# Patient Record
Sex: Male | Born: 1945 | Hispanic: No | Marital: Married | Smoking: Never smoker
Health system: Southern US, Community
[De-identification: ages and names within clinical notes are randomized; demographics above are authoritative.]

## PROBLEM LIST (undated history)

## (undated) DIAGNOSIS — G709 Myoneural disorder, unspecified: Secondary | ICD-10-CM

## (undated) DIAGNOSIS — F419 Anxiety disorder, unspecified: Secondary | ICD-10-CM

## (undated) HISTORY — PX: COLONOSCOPY: SHX174

---

## 2003-02-23 ENCOUNTER — Emergency Department (HOSPITAL_COMMUNITY): Admission: EM | Admit: 2003-02-23 | Discharge: 2003-02-23 | Payer: Self-pay | Admitting: Emergency Medicine

## 2005-09-23 ENCOUNTER — Emergency Department (HOSPITAL_COMMUNITY): Admission: EM | Admit: 2005-09-23 | Discharge: 2005-09-23 | Payer: Self-pay | Admitting: Emergency Medicine

## 2006-05-22 ENCOUNTER — Emergency Department (HOSPITAL_COMMUNITY): Admission: EM | Admit: 2006-05-22 | Discharge: 2006-05-22 | Payer: Self-pay | Admitting: Emergency Medicine

## 2007-09-06 ENCOUNTER — Emergency Department (HOSPITAL_COMMUNITY): Admission: EM | Admit: 2007-09-06 | Discharge: 2007-09-06 | Payer: Self-pay | Admitting: Emergency Medicine

## 2011-08-24 LAB — BASIC METABOLIC PANEL
BUN: 18
CO2: 26
Calcium: 8.6
Chloride: 99
Creatinine, Ser: 0.97
GFR calc Af Amer: >60
GFR calc non Af Amer: >60
Glucose, Bld: 131 — ABNORMAL HIGH
Potassium: 3.6
Sodium: 134 — ABNORMAL LOW

## 2011-08-24 LAB — DIFFERENTIAL
Basophils Absolute: 0
Basophils Relative: 1
Eosinophils Absolute: 0.2
Eosinophils Relative: 5
Lymphocytes Relative: 45
Lymphs Abs: 2.3
Monocytes Absolute: 0.3
Monocytes Relative: 6
Neutro Abs: 2.2
Neutrophils Relative %: 44

## 2011-08-24 LAB — CBC
HCT: 43.2
Hemoglobin: 14.9
MCHC: 34.5
MCV: 91.6
Platelets: 190
RBC: 4.72
RDW: 12.8
WBC: 5.1

## 2011-08-24 LAB — URINALYSIS, ROUTINE W REFLEX MICROSCOPIC
Bilirubin Urine: NEGATIVE
Glucose, UA: NEGATIVE
Ketones, ur: NEGATIVE
Leukocytes, UA: NEGATIVE
Nitrite: NEGATIVE
Protein, ur: NEGATIVE
Specific Gravity, Urine: 1.025
Urobilinogen, UA: 0.2
pH: 6

## 2011-08-24 LAB — URINE MICROSCOPIC-ADD ON

## 2014-03-05 ENCOUNTER — Other Ambulatory Visit: Payer: Self-pay | Admitting: Internal Medicine

## 2014-03-05 ENCOUNTER — Ambulatory Visit
Admission: RE | Admit: 2014-03-05 | Discharge: 2014-03-05 | Disposition: A | Discharge status: Home / Self Care | Payer: 59 | Source: Ambulatory Visit | Attending: Internal Medicine | Admitting: Internal Medicine

## 2014-03-05 DIAGNOSIS — M545 Low back pain, unspecified: Secondary | ICD-10-CM

## 2014-03-05 DIAGNOSIS — M25559 Pain in unspecified hip: Secondary | ICD-10-CM

## 2014-03-05 DIAGNOSIS — M79602 Pain in left arm: Secondary | ICD-10-CM

## 2014-08-05 ENCOUNTER — Other Ambulatory Visit: Payer: Self-pay | Admitting: Family Medicine

## 2014-08-05 DIAGNOSIS — I803 Phlebitis and thrombophlebitis of lower extremities, unspecified: Secondary | ICD-10-CM

## 2014-08-06 ENCOUNTER — Other Ambulatory Visit: Payer: 59

## 2016-01-17 ENCOUNTER — Other Ambulatory Visit (HOSPITAL_COMMUNITY): Payer: Self-pay | Admitting: Psychiatry

## 2016-12-15 ENCOUNTER — Other Ambulatory Visit: Payer: Self-pay | Admitting: Orthopedic Surgery

## 2016-12-15 ENCOUNTER — Other Ambulatory Visit (HOSPITAL_COMMUNITY): Payer: Self-pay | Admitting: Orthopedic Surgery

## 2016-12-15 DIAGNOSIS — M25512 Pain in left shoulder: Secondary | ICD-10-CM

## 2016-12-26 ENCOUNTER — Ambulatory Visit (HOSPITAL_COMMUNITY): Payer: 59

## 2016-12-26 ENCOUNTER — Other Ambulatory Visit: Payer: Self-pay

## 2018-09-06 ENCOUNTER — Other Ambulatory Visit: Payer: Self-pay | Admitting: Family Medicine

## 2018-09-06 DIAGNOSIS — R1084 Generalized abdominal pain: Secondary | ICD-10-CM

## 2018-09-06 DIAGNOSIS — M549 Dorsalgia, unspecified: Secondary | ICD-10-CM

## 2018-09-06 DIAGNOSIS — R310 Gross hematuria: Secondary | ICD-10-CM

## 2018-09-10 ENCOUNTER — Ambulatory Visit
Admission: RE | Admit: 2018-09-10 | Discharge: 2018-09-10 | Disposition: A | Discharge status: Home / Self Care | Payer: 59 | Source: Ambulatory Visit | Attending: Family Medicine | Admitting: Family Medicine

## 2018-09-10 DIAGNOSIS — R310 Gross hematuria: Secondary | ICD-10-CM

## 2018-09-10 DIAGNOSIS — M549 Dorsalgia, unspecified: Secondary | ICD-10-CM

## 2018-09-10 DIAGNOSIS — R1084 Generalized abdominal pain: Secondary | ICD-10-CM

## 2018-09-18 DIAGNOSIS — N2889 Other specified disorders of kidney and ureter: Secondary | ICD-10-CM | POA: Diagnosis not present

## 2018-09-18 DIAGNOSIS — R310 Gross hematuria: Secondary | ICD-10-CM | POA: Diagnosis not present

## 2018-09-21 ENCOUNTER — Other Ambulatory Visit: Payer: Self-pay | Admitting: Urology

## 2018-09-25 NOTE — Patient Instructions (Addendum)
Derek May  09/25/2018   Your procedure is scheduled on: 09-28-18     Report to [facility] Main  Entrance    Report to admitting at 1:15 PM    Call this number if you have problems the morning of surgery [phone]     Remember: You may have a Clear Liquid Diet after Midnight until 9:15 AM. After 9:15 AM, nothing until after surgery.      CLEAR LIQUID DIET   Foods Allowed                                                                     Foods Excluded  Coffee and tea, regular and decaf                             liquids that you cannot  Plain Jell-O in any flavor                                             see through such as: Fruit ices (not with fruit pulp)                                     milk, soups, orange juice  Iced Popsicles                                    All solid food Carbonated beverages, regular and diet                                    Cranberry, grape and apple juices Sports drinks like Gatorade Lightly seasoned clear broth or consume(fat free) Sugar, honey syrup  Sample Menu Breakfast                                Lunch                                     Supper Cranberry juice                    Beef broth                            Chicken broth Jell-O                                     Grape juice                           Apple juice Coffee  or tea                        Jell-O                                      Popsicle                                                Coffee or tea                        Coffee or tea  _____________________________________________________________________    Take these medicines the morning of surgery with A SIP OF WATER: Lexapro (Escitalopram)    BRUSH YOUR TEETH MORNING OF SURGERY AND RINSE YOUR MOUTH OUT, NO CHEWING GUM CANDY OR MINTS.                                You may not have any metal on your body including hair pins and              piercings  Do not wear  jewelry, lotions, powders, colgone or deodorant             Men may shave face and neck.   Do not bring valuables to the hospital. [place].  Contacts, dentures or bridgework may not be worn into surgery.      Patients discharged the day of surgery will not be allowed to drive home.  Name and phone number of your driver: Bryker Fletchall [phone]               Please read over the following fact sheets you were given: _____________________________________________________________________             [facility] - Preparing for Surgery Before surgery, you can play an important role.  Because skin is not sterile, your skin needs to be as free of germs as possible.  You can reduce the number of germs on your skin by washing with CHG (chlorahexidine gluconate) soap before surgery.  CHG is an antiseptic cleaner which kills germs and bonds with the skin to continue killing germs even after washing. Please DO NOT use if you have an allergy to CHG or antibacterial soaps.  If your skin becomes reddened/irritated stop using the CHG and inform your nurse when you arrive at Short Stay. Do not shave (including legs and underarms) for at least 48 hours prior to the first CHG shower.  You may shave your face/neck. Please follow these instructions carefully:  1.  Shower with CHG Soap the night before surgery and the  morning of Surgery.  2.  If you choose to wash your hair, wash your hair first as usual with your  normal  shampoo.  3.  After you shampoo, rinse your hair and body thoroughly to remove the  shampoo.                           4.  Use CHG as you would any other liquid  soap.  You can apply chg directly  to the skin and wash                       Gently with a scrungie or clean washcloth.  5.  Apply the CHG Soap to your body ONLY FROM THE NECK DOWN.   Do not use on face/ open                           Wound or open sores. Avoid contact with  eyes, ears mouth and genitals (private parts).                       Wash face,  Genitals (private parts) with your normal soap.             6.  Wash thoroughly, paying special attention to the area where your surgery  will be performed.  7.  Thoroughly rinse your body with warm water from the neck down.  8.  DO NOT shower/wash with your normal soap after using and rinsing off  the CHG Soap.                9.  Pat yourself dry with a clean towel.            10.  Wear clean pajamas.            11.  Place clean sheets on your bed the night of your first shower and do not  sleep with pets. Day of Surgery : Do not apply any lotions/deodorants the morning of surgery.  Please wear clean clothes to the hospital/surgery center.  FAILURE TO FOLLOW THESE INSTRUCTIONS MAY RESULT IN THE CANCELLATION OF YOUR SURGERY PATIENT SIGNATURE_________________________________  NURSE SIGNATURE__________________________________  ________________________________________________________________________

## 2018-09-27 ENCOUNTER — Other Ambulatory Visit: Payer: Self-pay

## 2018-09-27 ENCOUNTER — Encounter (HOSPITAL_COMMUNITY)
Admission: RE | Admit: 2018-09-27 | Discharge: 2018-09-27 | Disposition: A | Discharge status: Home / Self Care | Payer: 59 | Source: Ambulatory Visit | Attending: Urology | Admitting: Urology

## 2018-09-27 ENCOUNTER — Encounter (HOSPITAL_COMMUNITY): Payer: Self-pay

## 2018-09-27 DIAGNOSIS — Z01812 Encounter for preprocedural laboratory examination: Secondary | ICD-10-CM | POA: Insufficient documentation

## 2018-09-27 DIAGNOSIS — D494 Neoplasm of unspecified behavior of bladder: Secondary | ICD-10-CM

## 2018-09-27 HISTORY — DX: Anxiety disorder, unspecified: F41.9

## 2018-09-27 HISTORY — DX: Myoneural disorder, unspecified: G70.9

## 2018-09-27 LAB — CBC
HCT: 47.3 % (ref 39.0–52.0)
HEMOGLOBIN: 15.8 g/dL (ref 13.0–17.0)
MCH: 30.4 pg (ref 26.0–34.0)
MCHC: 33.4 g/dL (ref 30.0–36.0)
MCV: 91 fL (ref 80.0–100.0)
Platelets: 175 10*3/uL (ref 150–400)
RBC: 5.2 MIL/uL (ref 4.22–5.81)
RDW: 12.6 % (ref 11.5–15.5)
WBC: 6.2 10*3/uL (ref 4.0–10.5)
nRBC: 0 % (ref 0.0–0.2)

## 2018-09-28 ENCOUNTER — Ambulatory Visit (HOSPITAL_COMMUNITY): Payer: 59 | Admitting: Anesthesiology

## 2018-09-28 ENCOUNTER — Observation Stay (HOSPITAL_COMMUNITY)
Admission: RE | Admit: 2018-09-28 | Discharge: 2018-09-29 | Disposition: A | Discharge status: Home / Self Care | Payer: 59 | Source: Ambulatory Visit | Attending: Urology | Admitting: Urology

## 2018-09-28 ENCOUNTER — Encounter (HOSPITAL_COMMUNITY)
Admission: RE | Disposition: A | Discharge status: Home / Self Care | Payer: Self-pay | Source: Ambulatory Visit | Attending: Urology

## 2018-09-28 ENCOUNTER — Encounter (HOSPITAL_COMMUNITY): Payer: Self-pay | Admitting: *Deleted

## 2018-09-28 DIAGNOSIS — D494 Neoplasm of unspecified behavior of bladder: Principal | ICD-10-CM | POA: Insufficient documentation

## 2018-09-28 HISTORY — PX: TRANSURETHRAL RESECTION OF BLADDER TUMOR: SHX2575

## 2018-09-28 LAB — HEMOGLOBIN AND HEMATOCRIT, BLOOD
HCT: 44.8 % (ref 39.0–52.0)
HEMOGLOBIN: 15 g/dL (ref 13.0–17.0)

## 2018-09-28 SURGERY — TURBT (TRANSURETHRAL RESECTION OF BLADDER TUMOR)
Anesthesia: General | Site: Bladder

## 2018-09-28 MED ORDER — SODIUM CHLORIDE 0.9 % IR SOLN
Status: DC | PRN
  Administered 2018-09-28: 1000 mL

## 2018-09-28 MED ORDER — LIDOCAINE 2% (20 MG/ML) 5 ML SYRINGE
INTRAMUSCULAR | Status: DC | PRN
  Administered 2018-09-28: 60 mg via INTRAVENOUS

## 2018-09-28 MED ORDER — GLYCOPYRROLATE PF 0.2 MG/ML IJ SOSY
PREFILLED_SYRINGE | INTRAMUSCULAR | Status: DC | PRN
  Administered 2018-09-28: .2 mg via INTRAVENOUS

## 2018-09-28 MED ORDER — SODIUM CHLORIDE 0.9 % IV SOLN
INTRAVENOUS | Status: DC
  Administered 2018-09-28: 21:00:00 via INTRAVENOUS
  Administered 2018-09-28: 1000 mL via INTRAVENOUS
  Administered 2018-09-29: 05:00:00 via INTRAVENOUS

## 2018-09-28 MED ORDER — ONDANSETRON HCL 4 MG/2ML IJ SOLN
INTRAMUSCULAR | Status: DC | PRN
  Administered 2018-09-28: 4 mg via INTRAVENOUS

## 2018-09-28 MED ORDER — MORPHINE SULFATE (PF) 4 MG/ML IV SOLN
INTRAVENOUS | Status: AC
  Administered 2018-09-28: 2 mg via INTRAVENOUS
  Filled 2018-09-28: qty 1

## 2018-09-28 MED ORDER — DIPHENHYDRAMINE HCL 12.5 MG/5ML PO ELIX: 12.5000 mg | ORAL_SOLUTION | Freq: Four times a day (QID) | ORAL | Status: DC | PRN

## 2018-09-28 MED ORDER — ACETAMINOPHEN 10 MG/ML IV SOLN: 1000.0000 mg | Freq: Once | INTRAVENOUS | Status: DC | PRN

## 2018-09-28 MED ORDER — DEXAMETHASONE SODIUM PHOSPHATE 10 MG/ML IJ SOLN
INTRAMUSCULAR | Status: DC | PRN
  Administered 2018-09-28: 5 mg via INTRAVENOUS

## 2018-09-28 MED ORDER — PROPOFOL 10 MG/ML IV BOLUS
INTRAVENOUS | Status: DC | PRN
  Administered 2018-09-28: 50 mg via INTRAVENOUS
  Administered 2018-09-28: 120 mg via INTRAVENOUS

## 2018-09-28 MED ORDER — LACTATED RINGERS IV SOLN
INTRAVENOUS | Status: DC
  Administered 2018-09-28 (×2): via INTRAVENOUS

## 2018-09-28 MED ORDER — SENNOSIDES-DOCUSATE SODIUM 8.6-50 MG PO TABS
2.0000 | ORAL_TABLET | Freq: Every day | ORAL | Status: DC
  Administered 2018-09-28: 2 via ORAL
  Filled 2018-09-28: qty 2

## 2018-09-28 MED ORDER — OXYCODONE HCL 5 MG/5ML PO SOLN
5.0000 mg | Freq: Once | ORAL | Status: DC | PRN
  Filled 2018-09-28: qty 5

## 2018-09-28 MED ORDER — OXYCODONE HCL 5 MG PO TABS: 5.0000 mg | ORAL_TABLET | Freq: Once | ORAL | Status: DC | PRN

## 2018-09-28 MED ORDER — EPHEDRINE SULFATE-NACL 50-0.9 MG/10ML-% IV SOSY
PREFILLED_SYRINGE | INTRAVENOUS | Status: DC | PRN
  Administered 2018-09-28: 10 mg via INTRAVENOUS

## 2018-09-28 MED ORDER — FENTANYL CITRATE (PF) 100 MCG/2ML IJ SOLN
INTRAMUSCULAR | Status: AC
  Filled 2018-09-28: qty 2

## 2018-09-28 MED ORDER — HYDROCODONE-ACETAMINOPHEN 5-325 MG PO TABS: 1.0000 | ORAL_TABLET | Freq: Once | ORAL | Status: DC

## 2018-09-28 MED ORDER — HYDROCODONE-ACETAMINOPHEN 5-325 MG PO TABS: 1.0000 | ORAL_TABLET | ORAL | 0 refills | Status: AC | PRN

## 2018-09-28 MED ORDER — ONDANSETRON HCL 4 MG/2ML IJ SOLN: 4.0000 mg | INTRAMUSCULAR | Status: DC | PRN

## 2018-09-28 MED ORDER — LORAZEPAM 1 MG PO TABS
1.0000 mg | ORAL_TABLET | Freq: Every day | ORAL | Status: DC
  Administered 2018-09-28: 1 mg via ORAL
  Filled 2018-09-28: qty 1

## 2018-09-28 MED ORDER — PROMETHAZINE HCL 25 MG/ML IJ SOLN: 6.2500 mg | INTRAMUSCULAR | Status: DC | PRN

## 2018-09-28 MED ORDER — ROCURONIUM BROMIDE 10 MG/ML (PF) SYRINGE
PREFILLED_SYRINGE | INTRAVENOUS | Status: DC | PRN
  Administered 2018-09-28: 10 mg via INTRAVENOUS
  Administered 2018-09-28: 40 mg via INTRAVENOUS

## 2018-09-28 MED ORDER — BELLADONNA ALKALOIDS-OPIUM 16.2-60 MG RE SUPP
RECTAL | Status: AC
  Administered 2018-09-28: 1 via RECTAL
  Filled 2018-09-28: qty 1

## 2018-09-28 MED ORDER — OXYBUTYNIN CHLORIDE 5 MG PO TABS
ORAL_TABLET | ORAL | Status: AC
  Administered 2018-09-28: 5 mg via ORAL
  Filled 2018-09-28: qty 1

## 2018-09-28 MED ORDER — MORPHINE SULFATE (PF) 4 MG/ML IV SOLN
2.0000 mg | INTRAVENOUS | Status: DC | PRN
  Administered 2018-09-28 (×2): 2 mg via INTRAVENOUS

## 2018-09-28 MED ORDER — CEFAZOLIN SODIUM-DEXTROSE 2-4 GM/100ML-% IV SOLN
2.0000 g | INTRAVENOUS | Status: AC
  Administered 2018-09-28: 2 g via INTRAVENOUS

## 2018-09-28 MED ORDER — FENTANYL CITRATE (PF) 100 MCG/2ML IJ SOLN
25.0000 ug | INTRAMUSCULAR | Status: DC | PRN
  Administered 2018-09-28 (×2): 25 ug via INTRAVENOUS

## 2018-09-28 MED ORDER — CEFAZOLIN SODIUM-DEXTROSE 2-4 GM/100ML-% IV SOLN
INTRAVENOUS | Status: AC
  Filled 2018-09-28: qty 100

## 2018-09-28 MED ORDER — BELLADONNA ALKALOIDS-OPIUM 16.2-60 MG RE SUPP
1.0000 | Freq: Four times a day (QID) | RECTAL | Status: DC | PRN
  Administered 2018-09-28: 1 via RECTAL

## 2018-09-28 MED ORDER — BACITRACIN-NEOMYCIN-POLYMYXIN 400-5-5000 EX OINT: 1.0000 "application " | TOPICAL_OINTMENT | Freq: Three times a day (TID) | CUTANEOUS | Status: DC | PRN

## 2018-09-28 MED ORDER — FENTANYL CITRATE (PF) 100 MCG/2ML IJ SOLN
INTRAMUSCULAR | Status: DC | PRN
  Administered 2018-09-28 (×2): 50 ug via INTRAVENOUS

## 2018-09-28 MED ORDER — OXYBUTYNIN CHLORIDE 5 MG PO TABS
5.0000 mg | ORAL_TABLET | Freq: Three times a day (TID) | ORAL | Status: DC | PRN
  Administered 2018-09-28: 5 mg via ORAL

## 2018-09-28 MED ORDER — MENTHOL 3 MG MT LOZG
1.0000 | LOZENGE | OROMUCOSAL | Status: DC | PRN
  Filled 2018-09-28: qty 9

## 2018-09-28 MED ORDER — ESCITALOPRAM OXALATE 20 MG PO TABS
20.0000 mg | ORAL_TABLET | Freq: Every day | ORAL | Status: DC
  Administered 2018-09-29: 20 mg via ORAL
  Filled 2018-09-28: qty 1

## 2018-09-28 MED ORDER — SODIUM CHLORIDE 0.9 % IR SOLN
3000.0000 mL | Status: DC
  Administered 2018-09-28: 3000 mL

## 2018-09-28 MED ORDER — ACETAMINOPHEN 325 MG PO TABS: 650.0000 mg | ORAL_TABLET | ORAL | Status: DC | PRN

## 2018-09-28 MED ORDER — DIPHENHYDRAMINE HCL 50 MG/ML IJ SOLN: 12.5000 mg | Freq: Four times a day (QID) | INTRAMUSCULAR | Status: DC | PRN

## 2018-09-28 MED ORDER — OXYCODONE HCL 5 MG PO TABS
5.0000 mg | ORAL_TABLET | ORAL | Status: DC | PRN
  Administered 2018-09-28 – 2018-09-29 (×2): 5 mg via ORAL
  Filled 2018-09-28 (×2): qty 1

## 2018-09-28 MED ORDER — FENTANYL CITRATE (PF) 100 MCG/2ML IJ SOLN
INTRAMUSCULAR | Status: AC
  Administered 2018-09-28: 25 ug via INTRAVENOUS
  Filled 2018-09-28: qty 2

## 2018-09-28 MED ORDER — GLYCOPYRROLATE PF 0.2 MG/ML IJ SOSY
PREFILLED_SYRINGE | INTRAMUSCULAR | Status: AC
  Filled 2018-09-28: qty 2

## 2018-09-28 MED ORDER — SUCCINYLCHOLINE CHLORIDE 20 MG/ML IJ SOLN
INTRAMUSCULAR | Status: DC | PRN
  Administered 2018-09-28: 100 mg via INTRAVENOUS

## 2018-09-28 MED ORDER — SODIUM CHLORIDE 0.9 % IR SOLN
Status: DC | PRN
  Administered 2018-09-28: 24000 mL

## 2018-09-28 MED ORDER — SUGAMMADEX SODIUM 200 MG/2ML IV SOLN
INTRAVENOUS | Status: DC | PRN
  Administered 2018-09-28: 150 mg via INTRAVENOUS

## 2018-09-28 MED ORDER — PROPOFOL 10 MG/ML IV BOLUS
INTRAVENOUS | Status: AC
  Filled 2018-09-28: qty 20

## 2018-09-28 SURGICAL SUPPLY — 15 items
BAG URINE DRAINAGE (UROLOGICAL SUPPLIES) ×2 IMPLANT
BAG URO CATCHER STRL LF (MISCELLANEOUS) ×2 IMPLANT
CATH FOLEY 2WAY SLVR  5CC 20FR (CATHETERS) ×1
CATH FOLEY 2WAY SLVR 5CC 20FR (CATHETERS) ×1 IMPLANT
COVER WAND RF STERILE (DRAPES) IMPLANT
ELECT REM PT RETURN 15FT ADLT (MISCELLANEOUS) ×2 IMPLANT
GLOVE BIO SURGEON STRL SZ7.5 (GLOVE) ×2 IMPLANT
GOWN STRL REUS W/TWL LRG LVL3 (GOWN DISPOSABLE) ×2 IMPLANT
LOOP CUT BIPOLAR 24F LRG (ELECTROSURGICAL) ×2 IMPLANT
MANIFOLD NEPTUNE II (INSTRUMENTS) ×2 IMPLANT
PACK CYSTO (CUSTOM PROCEDURE TRAY) ×2 IMPLANT
SET ASPIRATION TUBING (TUBING) IMPLANT
SYRINGE IRR TOOMEY STRL 70CC (SYRINGE) ×2 IMPLANT
TUBING CONNECTING 10 (TUBING) ×2 IMPLANT
TUBING UROLOGY SET (TUBING) ×2 IMPLANT

## 2018-09-28 NOTE — H&P (Signed)
CC/HPI: Cc: Gross hematuria, left flank pain  HPI:  09/11/2018:  Patient has a history of microscopic hematuria. He has had a negative workup in the past. He recently experienced gross hematuria one week ago. This lasted 3-4 days. This has resolved. He did not really have much pain with urination at the time. He has persistent microscopic hematuria. He denies dysuria. He does have left flank pain that has been occurring for 4-5 months. He had a renal ultrasound performed yesterday which revealed a possible bladder mass but no hydronephrosis bilaterally.   09/19/2018:  Patient underwent a CT IVP yesterday. This revealed a large anterior bladder mass that is at least 4.6 cm. He had some gross hematuria Monday. He rested yesterday and this seems to be clearing up for him. He is having some anterior pelvic pain.     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 Encounter for Prostate Cancer screening, Prostate cancer screening - 2014 Gross hematuria, Gross hematuria - 2014 Low back pain, Lumbago - 2014 Other microscopic hematuria, Microscopic hematuria - 2014      PMH Notes:  1898-11-14 00:00:00 - Note: Normal Routine History And Physical Adult   NON-GU PMH: Personal history of other mental and behavioral disorders, History of depression - 2014 Anxiety Depression    FAMILY HISTORY: Family Health Status - Father alive at age 53 - 60 In Family Family Health Status - Mother's Age - Runs In Family Family Health Status Number - Runs In Family   SOCIAL HISTORY: Marital Status: Married Current Smoking Status: Patient has never smoked.   Tobacco Use Assessment Completed: Used Tobacco in last 30 days? Drinks 1 caffeinated drink per day.     Notes: Never A Smoker, Occupation:, Alcohol  Use, Caffeine Use, Marital History - Currently Married   REVIEW OF SYSTEMS:    GU Review Male:   Patient denies frequent urination, hard to postpone urination, burning/ pain with urination, get up at night to urinate, leakage of urine, stream starts and stops, trouble starting your stream, have to strain to urinate , erection problems, and penile pain.  Gastrointestinal (Upper):   Patient denies nausea, vomiting, and indigestion/ heartburn.  Gastrointestinal (Lower):   Patient denies diarrhea and constipation.  Constitutional:   Patient denies fever, night sweats, weight loss, and fatigue.  Skin:   Patient denies skin rash/ lesion and itching.  Eyes:   Patient denies double vision and blurred vision.  Ears/ Nose/ Throat:   Patient denies sore throat and sinus problems.  Hematologic/Lymphatic:   Patient denies swollen glands and easy bruising.  Cardiovascular:   Patient denies leg swelling and chest pains.  Respiratory:   Patient denies cough and shortness of breath.  Endocrine:   Patient denies excessive thirst.  Musculoskeletal:   Patient denies back pain and joint pain.  Neurological:   Patient denies headaches and dizziness.  Psychologic:   Patient denies depression and anxiety.   VITAL SIGNS:      09/19/2018 12:22 PM  BP 117/63 mmHg  Pulse 58 /min  Temperature 98.1 F / 36.7 C   MULTI-SYSTEM PHYSICAL EXAMINATION:    Constitutional: Well-nourished. No physical deformities. Normally developed. Good grooming.  Respiratory: No labored breathing, no use of accessory muscles.   Cardiovascular: Normal  temperature, adequate perfusion of extremities  Skin: No paleness, no jaundice  Neurologic / Psychiatric: Oriented to time, oriented to place, oriented to person. No depression, no anxiety, no agitation.  Gastrointestinal: No mass, no tenderness, no rigidity, non obese abdomen.  Eyes: Normal conjunctivae. Normal eyelids.  Musculoskeletal: Normal gait and station of head and neck.     PAST  DATA REVIEWED:  Source Of History:  Patient  X-Ray Review: C.T. Abdomen/Pelvis: Reviewed Films. Reviewed Report. Discussed With Patient.     09/11/18 12/23/11 09/25/07  PSA  Total PSA 0.72 ng/mL 0.58  0.83     PROCEDURES:         Flexible Cystoscopy - 52000  Risks, benefits, and some of the potential complications of the procedure were discussed at length with the patient including infection, bleeding, voiding discomfort, urinary retention, fever, chills, sepsis, and others. All questions were answered. Informed consent was obtained. Antibiotic prophylaxis was given. Sterile technique and intraurethral analgesia were used.  Meatus:  Normal size. Normal location. Normal condition.  Urethra:  No strictures.  External Sphincter:  Normal.  Verumontanum:  Normal.  Prostate:  Borderline obstructing. Mild hyperplasia.   Bladder Neck:  Non-obstructing.  Ureteral Orifices:  Normal location. Normal size. Normal shape. Effluxed clear urine.  Bladder:  Large papillary bladder mass from the anterior bladder wall, about 5 cm, 3 cm separate dome tumor and scattered other tumors. There is a 1 cm tumor next to the left ureteral orifice but the bilateral ureteral orifices did not appear to be involved.       The lower urinary tract was carefully examined. The procedure was well-tolerated and without complications. Antibiotic instructions were given. Instructions were given to call the office immediately for bloody urine, difficulty urinating, urinary retention, painful or frequent urination, fever, chills, nausea, vomiting or other illness. The patient stated that he understood these instructions and would comply with them.         Urinalysis w/Scope Dipstick Dipstick Cont'd Micro  Color: Yellow Bilirubin: Neg mg/dL WBC/hpf: 10 - 20/hpf  Appearance: Cloudy Ketones: Neg mg/dL RBC/hpf: 40 - 60/hpf  Specific Gravity: 1.025 Blood: 3+ ery/uL Bacteria: Few (10-25/hpf)  pH: <=5.0 Protein: 2+ mg/dL Cystals: NS  (Not Seen)  Glucose: Neg mg/dL Urobilinogen: 0.2 mg/dL Casts: NS (Not Seen)    Nitrites: Neg Trichomonas: Not Present    Leukocyte Esterase: Neg leu/uL Mucous: Not Present      Epithelial Cells: NS (Not Seen)      Yeast: NS (Not Seen)      Sperm: Not Present    ASSESSMENT:      ICD-10 Details  1 GU:   Bladder tumor/neoplasm - D41.4   2   Gross hematuria - R31.0    PLAN:           Document Letter(s):  Created for Patient: Clinical Summary         Notes:   Proceed with transurethral resection bladder tumor. He understands potential risks including but not limited to bleeding, infection, injury to surrounding structures, potential for bladder perforation, need for additional procedures. This appears to be a advanced tumor on imaging. I did discuss briefly that there is a chance he may need a cystectomy with or without neoadjuvant chemotherapy if muscle invasive advance bladder tumor is confirmed.   CC: Kristie Cowman, M.D.   Signed by Link Snuffer, III, M.D. on 09/19/18 at 1:02 PM (EST

## 2018-09-28 NOTE — Anesthesia Preprocedure Evaluation (Addendum)
Anesthesia Evaluation  Patient identified by MRN, date of birth, ID band Patient awake    Reviewed: Allergy & Precautions, NPO status , Patient's Chart, lab work & pertinent test results  History of Anesthesia Complications Negative for: history of anesthetic complications  Airway Mallampati: I  TM Distance: >3 FB Neck ROM: Full    Dental no notable dental hx. (+) Teeth Intact, Dental Advisory Given   Pulmonary neg pulmonary ROS,    Pulmonary exam normal breath sounds clear to auscultation       Cardiovascular negative cardio ROS Normal cardiovascular exam Rhythm:Regular Rate:Normal     Neuro/Psych Anxiety negative neurological ROS     GI/Hepatic negative GI ROS, Neg liver ROS,   Endo/Other  negative endocrine ROS  Renal/GU negative Renal ROS   Bladder mass    Musculoskeletal negative musculoskeletal ROS (+)   Abdominal   Peds  Hematology negative hematology ROS (+)   Anesthesia Other Findings Day of surgery medications reviewed with the patient.  Reproductive/Obstetrics                            Anesthesia Physical Anesthesia Plan  ASA: II  Anesthesia Plan: General   Post-op Pain Management:    Induction: Intravenous  PONV Risk Score and Plan: 2 and Treatment may vary due to age or medical condition, Ondansetron and Dexamethasone  Airway Management Planned: LMA  Additional Equipment:   Intra-op Plan:   Post-operative Plan: Extubation in OR  Informed Consent: I have reviewed the patients History and Physical, chart, labs and discussed the procedure including the risks, benefits and alternatives for the proposed anesthesia with the patient or authorized representative who has indicated his/her understanding and acceptance.   Dental advisory given  Plan Discussed with: CRNA  Anesthesia Plan Comments:        Anesthesia Quick Evaluation

## 2018-09-28 NOTE — Anesthesia Procedure Notes (Signed)
Procedure Name: LMA Insertion Performed by: Gean Maidens, CRNA Pre-anesthesia Checklist: Patient identified, Emergency Drugs available, Suction available, Patient being monitored and Timeout performed Patient Re-evaluated:Patient Re-evaluated prior to induction Oxygen Delivery Method: Circle system utilized Induction Type: IV induction Ventilation: Mask ventilation without difficulty LMA: LMA inserted LMA Size: 4.0 Number of attempts: 1 Placement Confirmation: positive ETCO2 and breath sounds checked- equal and bilateral Tube secured with: Tape Dental Injury: Teeth and Oropharynx as per pre-operative assessment

## 2018-09-28 NOTE — Anesthesia Postprocedure Evaluation (Signed)
Anesthesia Post Note  Patient: Derek May  Procedure(s) Performed: TRANSURETHRAL RESECTION OF BLADDER TUMOR (TURBT) (N/A Bladder)     Patient location during evaluation: PACU Anesthesia Type: General Level of consciousness: awake and alert Pain management: pain level controlled Vital Signs Assessment: post-procedure vital signs reviewed and stable Respiratory status: spontaneous breathing, nonlabored ventilation and respiratory function stable Cardiovascular status: blood pressure returned to baseline and stable Postop Assessment: no apparent nausea or vomiting Anesthetic complications: no    Last Vitals:  Vitals:   09/28/18 1745 09/28/18 1801  BP: (!) 142/84 (!) 156/86  Pulse: 64 66  Resp: 14 15  Temp:  36.4 C  SpO2: 100% 100%    Last Pain:  Vitals:   09/28/18 1930  TempSrc:   PainSc: 0-No pain                 Lynda Rainwater

## 2018-09-28 NOTE — Significant Event (Signed)
Rapid Response Event Note  Overview: Time Called: 2055 Arrival Time: 2100 Event Type: Hypotension, Other (Comment)  Initial Focused Assessment: Called per RN d/t pt AMS and low b/p after getting out of bed for BRP.  Upon entering room pt is in bed with staff and daughter at the bedside.  Pt has some language barrier.  Pt is now able to answer questions appropriately for me.  C/O pressure in his abdomen area.  Pt daughter able to clarify for pt.  Pt has CBI.    Interventions:  Pt VSS, see flow sheet.  Pt foley irrigated for clots, adjusted CBI rate.  Pt A/O at this time.  Pt also c/o some constipation.  Pt asked not to do a lot of straining and pushing.  Pt RN will address with prn medication.    Plan of Care (if not transferred): will stay in room 1435, pt RN informed to call if there were any more issues.  Event Summary:  Arrived at 2100 and completed at 2148.  Dyann Ruddle

## 2018-09-28 NOTE — Op Note (Signed)
Operative Note  Preoperative diagnosis:  1. Bladder tumor--large  Postoperative diagnosis: 1. Bladder tumor--large, multifocal  Procedure(s): 1. Transurethral resection of bladder tumor--large 2.  Urethral dilation  Surgeon: Link Snuffer, MD  Assistants:none  Anesthesia: General  Complications: none immediate  EBL: 25 cc  Specimens: 1. Bladder tumor  Drains/Catheters: 1. 20 French Foley catheter  Intraoperative findings: notable bladder tumors.  The largest tumor was greater than 5 cm, probably about 7 cm and on the anterior bladder wall.  This appeared high-grade and invasive.  Bilateral ureteral orifices were not involved with tumor.  There was also a left lateral wall tumor that was about 3 cm.  There was also a bladder neck tumor that was resected.  This was smaller.  There were multiple other smaller tumors that were resected.  Appeared to resect down to muscle fibers.  I avoided ureteral orifices.there did not appear to be significant active bleeding at the end of the procedure.  There was some deep resection throughout in order to resect the tumor.  The anterior bladder wall mass did appear to be possibly muscle invasive.  Indication: 72 year old male recently underwent a CT IVP which revealed evidence of a large anterior bladder wall tumor.  Cystoscopy confirmed tumor and he presents for the previously mentioned operation.  Description of procedure:  The patient was identified and consent was obtained.  The patient was taken to the operating room and placed in the supine position.  The patient was placed under General anesthesia.  Perioperative antibiotics were administered.  The patient was placed in dorsal lithotomy.  Patient was prepped and draped in a standard sterile fashion and a timeout was performed.  The urethra first required dilation to 70 [place] with sounds.  This was performed sequentially from 81-30 [place].  A 26 French resectoscope with a visual obturator  in place was then advanced into the urethra and into the bladder.  The entire urethra was normal.  The bladder mucosa was inspected and all bladder tumors were identified.  The working element was exchanged.  On bipolar settings, all superficial tumor that was visible was resected.  The anterior bladder wall mass did appear to be possibly muscle invasive.  I did resect to muscle fibers.  I fulgurated the entire tumor bed, all of them.  I evacuated all bladder specimen and reinspected the bladder mucosa.  Bladder mucosa was fulgurated again and with the water turned off, I did not see any significant active bleeding.  Given the extensive resection, I decided to leave a 20 [place] Foley catheter.  The patient tolerated the procedure well and was stable postoperatively.  Plan:given the extensive resection, I would like to keep the patient overnight for observation.  He will go home with the Foley catheter.  If needed, nursing may irrigate the catheter.  I will try to avoid continuous bladder irrigation given the extensive resection but it can be started if necessary.

## 2018-09-28 NOTE — Progress Notes (Signed)
Dr Gloriann Loan notified pt preparing to be discharged and foley noted to be clotted.  Foley gently irrigated by RN, clot removed and began to flow.  Dr Gloriann Loan in unit and aware foley had clotted again. Dr Gloriann Loan irrigated catheter, removed cathereter and placed 3 way.  Pt tolerated well, medicated with pain relief.  Family at bedside and updated by Dr Gloriann Loan and nursing staff

## 2018-09-28 NOTE — Transfer of Care (Signed)
Immediate Anesthesia Transfer of Care Note  Patient: Derek May  Procedure(s) Performed: TRANSURETHRAL RESECTION OF BLADDER TUMOR (TURBT) (N/A Bladder)  Patient Location: PACU  Anesthesia Type:General  Level of Consciousness: awake  Airway & Oxygen Therapy: Patient Spontanous Breathing and Patient connected to face mask oxygen  Post-op Assessment: Report given to RN and Post -op Vital signs reviewed and stable  Post vital signs: Reviewed and stable  Last Vitals:  Vitals Value Taken Time  BP    Temp    Pulse 102 09/28/2018  5:05 PM  Resp 16 09/28/2018  5:06 PM  SpO2 100 % 09/28/2018  5:05 PM  Vitals shown include unvalidated device data.  Last Pain:  Vitals:   09/28/18 1341  TempSrc:   PainSc: 0-No pain         Complications: No apparent anesthesia complications

## 2018-09-28 NOTE — Anesthesia Procedure Notes (Signed)
Procedure Name: Intubation Performed by: Gean Maidens, CRNA Pre-anesthesia Checklist: Patient identified, Emergency Drugs available, Suction available, Timeout performed and Patient being monitored Patient Re-evaluated:Patient Re-evaluated prior to induction Oxygen Delivery Method: Circle system utilized Preoxygenation: Pre-oxygenation with 100% oxygen Induction Type: IV induction Ventilation: Mask ventilation without difficulty Laryngoscope Size: Mac and 4 Grade View: Grade I Tube type: Oral Tube size: 7.5 mm Number of attempts: 1 Airway Equipment and Method: Stylet Placement Confirmation: ETT inserted through vocal cords under direct vision,  positive ETCO2 and breath sounds checked- equal and bilateral Secured at: 23 cm Tube secured with: Tape Dental Injury: Teeth and Oropharynx as per pre-operative assessment

## 2018-09-28 NOTE — Discharge Instructions (Addendum)
Transurethral Resection of Bladder Tumor (TURBT) or Bladder Biopsy   He will go home with Foley catheter.  Nurses will teach you how to use it.  This will come out when you come back for your appointment.  The reason for the catheter is to allow the bladder to heal.  He will notice some blood.  Do not be alarmed unless the catheter stops flowing which can be an emergency.  Definition:  Transurethral Resection of the Bladder Tumor is a surgical procedure used to diagnose and remove tumors within the bladder. TURBT is the most common treatment for early stage bladder cancer.  General instructions:     Your recent bladder surgery requires very little post hospital care but some definite precautions.  Despite the fact that no skin incisions were used, the area around the bladder incisions are raw and covered with scabs to promote healing and prevent bleeding. Certain precautions are needed to insure that the scabs are not disturbed over the next 2-4 weeks while the healing proceeds.  Because the raw surface inside your bladder and the irritating effects of urine you may expect frequency of urination and/or urgency (a stronger desire to urinate) and perhaps even getting up at night more often. This will usually resolve or improve slowly over the healing period. You may see some blood in your urine over the first 6 weeks. Do not be alarmed, even if the urine was clear for a while. Get off your feet and drink lots of fluids until clearing occurs. If you start to pass clots or don't improve call us.  Diet:  You may return to your normal diet immediately. Because of the raw surface of your bladder, alcohol, spicy foods, foods high in acid and drinks with caffeine may cause irritation or frequency and should be used in moderation. To keep your urine flowing freely and avoid constipation, drink plenty of fluids during the day (8-10 glasses). Tip: Avoid cranberry juice because it is very  acidic.  Activity:  Your physical activity doesn't need to be restricted. However, if you are very active, you may see some blood in the urine. We suggest that you reduce your activity under the circumstances until the bleeding has stopped.  Bowels:  It is important to keep your bowels regular during the postoperative period. Straining with bowel movements can cause bleeding. A bowel movement every other day is reasonable. Use a mild laxative if needed, such as milk of magnesia 2-3 tablespoons, or 2 Dulcolax tablets. Call if you continue to have problems. If you had been taking narcotics for pain, before, during or after your surgery, you may be constipated. Take a laxative if necessary.    Medication:  You should resume your pre-surgery medications unless told not to. In addition you may be given an antibiotic to prevent or treat infection. Antibiotics are not always necessary. All medication should be taken as prescribed until the bottles are finished unless you are having an unusual reaction to one of the drugs.

## 2018-09-29 ENCOUNTER — Other Ambulatory Visit: Payer: Self-pay

## 2018-09-29 ENCOUNTER — Encounter (HOSPITAL_COMMUNITY): Payer: Self-pay | Admitting: Urology

## 2018-09-29 DIAGNOSIS — D494 Neoplasm of unspecified behavior of bladder: Secondary | ICD-10-CM | POA: Diagnosis not present

## 2018-09-29 LAB — HEMOGLOBIN AND HEMATOCRIT, BLOOD
HCT: 42.6 % (ref 39.0–52.0)
Hemoglobin: 14.3 g/dL (ref 13.0–17.0)

## 2018-09-29 MED ORDER — PHENAZOPYRIDINE HCL 200 MG PO TABS: 200.0000 mg | ORAL_TABLET | Freq: Three times a day (TID) | ORAL | 0 refills | Status: DC | PRN

## 2018-09-29 MED ORDER — HYDROCODONE-ACETAMINOPHEN 10-325 MG PO TABS: 1.0000 | ORAL_TABLET | ORAL | 0 refills | Status: DC | PRN

## 2018-09-29 MED ORDER — TOLTERODINE TARTRATE ER 4 MG PO CP24: 4.0000 mg | ORAL_CAPSULE | Freq: Every day | ORAL | 0 refills | Status: AC

## 2018-09-29 NOTE — Progress Notes (Signed)
Pt discharged to home, instructions reviewed with daughter and son as pt is not fluent on [place] language. Acknowledged understanding. Pt discharged with foley, leg bag to drainage bag teaching completed, pt able demonstrate how emptying. SRP, RN

## 2018-09-29 NOTE — Plan of Care (Addendum)
Pt's VS are stable. Pt stated through daughter that he wouldn't be able to have a BM on BP or BSC. Pt ambulated to BR and likely had a vagal episode. Pt unable to follow commands or answer questions.  RR, CN notified. Pt BP 73/55. Pt was transferred back to bed and stablized  Pt is NPO per order for possible second surgery in the am. Pt refused pain meds at midnight. He did however, ask for meds at 0445 for pain and pressure  4/10. Son in room with pt.

## 2018-09-29 NOTE — Discharge Summary (Signed)
  Physician Discharge Summary      Patient ID: Derek May MRN: [id number] DOB/AGE: [birth year] 72 y.o.  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  Due to the large size of the tumor and some postoperative bleeding it was felt he should be observed overnight rather than discharged home.  A three-way Foley catheter was placed and CBI was initiated.  Overnight his urine has nearly cleared.  He is having some intermittent bladder pains consistent with bladder spasms.  He got up overnight to have a bowel movement and strained and had a decrease in his blood pressure felt to be vasovagal.  His hemoglobin decreased by 1 g likely secondary to operative blood loss but there appears to be no further significant hematuria and he is therefore felt to be ready for discharge.  He will be discharged with a Foley catheter indwelling.  I have discussed the need to continue oral fluids and decreased activity level.  Discharge Exam: Blood pressure 128/73, pulse (!) 59, temperature 98.1 F (36.7 C), temperature source Oral, resp. rate 12, SpO2 96 %. General: Awake, alert and in no apparent distress. Chest: Normal respiratory effort. Cardiovascular: Regular rate and rhythm. Abdomen: Soft, nontender, nondistended. GU: Foley catheter indwelling and draining with no clots.  CBI is nearly off and fluid in tubing is nearly clear.   Disposition: Discharge disposition: 01-Home or Self Care       Discharge Instructions    Discharge patient   Complete by:  As directed    Discharge disposition:  01-Home or Self Care   Discharge patient date:  09/29/2018     Allergies as of 09/29/2018      Reactions   Ibuprofen Rash      Medication List    TAKE these medications     escitalopram 20 MG tablet Commonly known as:  LEXAPRO Take 20 mg by mouth daily.   HYDROcodone-acetaminophen 5-325 MG tablet Commonly known as:  NORCO/VICODIN Take 1 tablet by mouth every 4 (four) hours as needed for moderate pain.   HYDROcodone-acetaminophen 10-325 MG tablet Commonly known as:  NORCO Take 1-2 tablets by mouth every 4 (four) hours as needed for moderate pain. Maximum dose per 24 hours - 8 pills   LORazepam 1 MG tablet Commonly known as:  ATIVAN Take 1 mg by mouth at bedtime.   phenazopyridine 200 MG tablet Commonly known as:  PYRIDIUM Take 1 tablet (200 mg total) by mouth 3 (three) times daily as needed for pain.   tolterodine 4 MG 24 hr capsule Commonly known as:  DETROL LA Take 1 capsule (4 mg total) by mouth daily.   Vitamin D 50 MCG (2000 UT) Caps Take 2,000 Units by mouth daily.      Follow-up Information    Call Lucas Mallow, MD.   Specialty:  Urology Why:  For an appointment in ~1 week when you get home. Contact information: [place] [id number] [phone]           Signed: Claybon Jabs 09/29/2018, 7:12 AM

## 2018-10-01 ENCOUNTER — Encounter (HOSPITAL_COMMUNITY): Payer: Self-pay | Admitting: Emergency Medicine

## 2018-10-01 ENCOUNTER — Emergency Department (HOSPITAL_COMMUNITY): Payer: 59

## 2018-10-01 ENCOUNTER — Other Ambulatory Visit: Payer: Self-pay

## 2018-10-01 ENCOUNTER — Emergency Department (HOSPITAL_COMMUNITY)
Admission: EM | Admit: 2018-10-01 | Discharge: 2018-10-02 | Disposition: A | Discharge status: Home / Self Care | Payer: 59 | Attending: Emergency Medicine | Admitting: Emergency Medicine

## 2018-10-01 DIAGNOSIS — R369 Urethral discharge, unspecified: Secondary | ICD-10-CM | POA: Insufficient documentation

## 2018-10-01 DIAGNOSIS — Z79899 Other long term (current) drug therapy: Secondary | ICD-10-CM | POA: Insufficient documentation

## 2018-10-01 DIAGNOSIS — R5082 Postprocedural fever: Secondary | ICD-10-CM

## 2018-10-01 DIAGNOSIS — R509 Fever, unspecified: Secondary | ICD-10-CM | POA: Diagnosis not present

## 2018-10-01 DIAGNOSIS — R1030 Lower abdominal pain, unspecified: Secondary | ICD-10-CM | POA: Diagnosis not present

## 2018-10-01 LAB — CBC WITH DIFFERENTIAL/PLATELET
Abs Immature Granulocytes: 0.07 10*3/uL (ref 0.00–0.07)
BASOS PCT: 1 %
Basophils Absolute: 0.1 10*3/uL (ref 0.0–0.1)
Eosinophils Absolute: 0.5 10*3/uL (ref 0.0–0.5)
Eosinophils Relative: 6 %
HCT: 40.1 % (ref 39.0–52.0)
HEMOGLOBIN: 13.4 g/dL (ref 13.0–17.0)
Immature Granulocytes: 1 %
LYMPHS PCT: 24 %
Lymphs Abs: 2.1 10*3/uL (ref 0.7–4.0)
MCH: 30.9 pg (ref 26.0–34.0)
MCHC: 33.4 g/dL (ref 30.0–36.0)
MCV: 92.6 fL (ref 80.0–100.0)
MONOS PCT: 11 %
Monocytes Absolute: 1 10*3/uL (ref 0.1–1.0)
NEUTROS PCT: 57 %
Neutro Abs: 5.1 10*3/uL (ref 1.7–7.7)
PLATELETS: 142 10*3/uL — AB (ref 150–400)
RBC: 4.33 MIL/uL (ref 4.22–5.81)
RDW: 12.6 % (ref 11.5–15.5)
WBC: 8.8 10*3/uL (ref 4.0–10.5)
nRBC: 0 % (ref 0.0–0.2)

## 2018-10-01 LAB — I-STAT CG4 LACTIC ACID, ED: Lactic Acid, Venous: 0.5 mmol/L (ref 0.5–1.9)

## 2018-10-01 LAB — BASIC METABOLIC PANEL
Anion gap: 6 (ref 5–15)
BUN: 18 mg/dL (ref 8–23)
CALCIUM: 8.6 mg/dL — AB (ref 8.9–10.3)
CO2: 29 mmol/L (ref 22–32)
CREATININE: 1.27 mg/dL — AB (ref 0.61–1.24)
Chloride: 99 mmol/L (ref 98–111)
GFR calc Af Amer: >60 mL/min (ref >60)
GFR calc non Af Amer: 55 mL/min — ABNORMAL LOW (ref >60)
Glucose, Bld: 115 mg/dL — ABNORMAL HIGH (ref 70–99)
POTASSIUM: 3.9 mmol/L (ref 3.5–5.1)
SODIUM: 134 mmol/L — AB (ref 135–145)

## 2018-10-01 NOTE — ED Provider Notes (Signed)
[facility] DEPT Provider Note   CSN: [id number] Arrival date & time: 10/01/18  2222     History   Chief Complaint Chief Complaint  Patient presents with  . Penile Discharge    HPI Derek May is a 72 y.o. male.  Patient presents to the emergency department with complaints of fever.  Patient underwent transurethral resection of a bladder tumor 3 days ago.  He stayed in the hospital overnight.  He reports that since discharge he has had bloating and lower abdominal pain.  He has noticed a yellow discharge from his penis around the Foley catheter.  This evening he documented a fever of 101, took Tylenol and came to the ER because he was instructed to come for any fevers.  He has not had any cough.  No nausea, vomiting or diarrhea.     Past Medical History:  Diagnosis Date  . Anxiety   . Neuromuscular disorder [facility])     Patient Active Problem List   Diagnosis Date Noted  . Bladder tumor 09/28/2018    Past Surgical History:  Procedure Laterality Date  . COLONOSCOPY    . TRANSURETHRAL RESECTION OF BLADDER TUMOR N/A 09/28/2018   Procedure: TRANSURETHRAL RESECTION OF BLADDER TUMOR (TURBT);  Surgeon: Lucas Mallow, MD;  Location: [facility] ORS;  Service: Urology;  Laterality: N/A;        Home Medications    Prior to Admission medications   Medication Sig Start Date End Date Taking? Authorizing Provider  Cholecalciferol (VITAMIN D) 50 MCG (2000 UT) CAPS Take 1,000 Units by mouth daily.    Yes [provider]  escitalopram (LEXAPRO) 20 MG tablet Take 20 mg by mouth daily.   Yes [provider]  HYDROcodone-acetaminophen (NORCO) 10-325 MG tablet Take 1-2 tablets by mouth every 4 (four) hours as needed for moderate pain. Maximum dose per 24 hours - 8 pills 09/29/18  Yes Kathie Rhodes, MD  LORazepam (ATIVAN) 1 MG tablet Take 1 mg by mouth at bedtime.   Yes [provider]  phenazopyridine (PYRIDIUM) 200 MG tablet  Take 1 tablet (200 mg total) by mouth 3 (three) times daily as needed for pain. 09/29/18  Yes Kathie Rhodes, MD  tolterodine (DETROL LA) 4 MG 24 hr capsule Take 1 capsule (4 mg total) by mouth daily. 09/29/18  Yes Kathie Rhodes, MD  HYDROcodone-acetaminophen (NORCO/VICODIN) 5-325 MG tablet Take 1 tablet by mouth every 4 (four) hours as needed for moderate pain. Patient not taking: Reported on 10/01/2018 09/28/18 09/28/19  Lucas Mallow, MD  sulfamethoxazole-trimethoprim (BACTRIM DS,SEPTRA DS) 800-160 MG tablet Take 1 tablet by mouth 2 (two) times daily. 10/02/18   Orpah Greek, MD    Family History No family history on file.  Social History Social History   Tobacco Use  . Smoking status: Never Smoker  . Smokeless tobacco: Never Used  Substance Use Topics  . Alcohol use: Never    Frequency: Never  . Drug use: Never     Allergies   Ibuprofen   Review of Systems Review of Systems  Constitutional: Positive for fever.  Gastrointestinal: Positive for abdominal pain.  All other systems reviewed and are negative.    Physical Exam Updated Vital Signs BP 120/68   Pulse 61   Temp 99.2 F (37.3 C) (Oral)   Resp 14   Ht 5\' 9"  (1.753 m)   Wt 71.2 kg   SpO2 97%   BMI 23.18 kg/m   Physical Exam  Constitutional:  He is oriented to person, place, and time. He appears well-developed and well-nourished. No distress.  HENT:  Head: Normocephalic and atraumatic.  Right Ear: Hearing normal.  Left Ear: Hearing normal.  Nose: Nose normal.  Mouth/Throat: Oropharynx is clear and moist and mucous membranes are normal.  Eyes: Pupils are equal, round, and reactive to light. Conjunctivae and EOM are normal.  Neck: Normal range of motion. Neck supple.  Cardiovascular: Regular rhythm, S1 normal and S2 normal. Exam reveals no gallop and no friction rub.  No murmur heard. Pulmonary/Chest: Effort normal and breath sounds normal. No respiratory distress. He exhibits no tenderness.   Abdominal: Soft. Normal appearance and bowel sounds are normal. There is no hepatosplenomegaly. There is tenderness in the suprapubic area. There is no rebound, no guarding, no tenderness at McBurney's point and negative Murphy's sign. No hernia.  Genitourinary:  Genitourinary Comments: Three-way Foley catheter in place.  Scant yellowish discharge present at meatus  Musculoskeletal: Normal range of motion.  Neurological: He is alert and oriented to person, place, and time. He has normal strength. No cranial nerve deficit or sensory deficit. Coordination normal. GCS eye subscore is 4. GCS verbal subscore is 5. GCS motor subscore is 6.  Skin: Skin is warm, dry and intact. No rash noted. No cyanosis.  Psychiatric: He has a normal mood and affect. His speech is normal and behavior is normal. Thought content normal.  Nursing note and vitals reviewed.    ED Treatments / Results  Labs (all labs ordered are listed, but only abnormal results are displayed) Labs Reviewed  CBC WITH DIFFERENTIAL/PLATELET - Abnormal; Notable for the following components:      Result Value   Platelets 142 (*)    All other components within normal limits  BASIC METABOLIC PANEL - Abnormal; Notable for the following components:   Sodium 134 (*)    Glucose, Bld 115 (*)    Creatinine, Ser 1.27 (*)    Calcium 8.6 (*)    GFR calc non Af Amer 55 (*)    All other components within normal limits  URINALYSIS, ROUTINE W REFLEX MICROSCOPIC - Abnormal; Notable for the following components:   Color, Urine AMBER (*)    APPearance HAZY (*)    Hgb urine dipstick LARGE (*)    Protein, ur 100 (*)    Nitrite POSITIVE (*)    Leukocytes, UA SMALL (*)    RBC / HPF >50 (*)    All other components within normal limits  URINE CULTURE  I-STAT CG4 LACTIC ACID, ED    EKG None  Radiology Dg Chest 2 View  Result Date: 10/02/2018 CLINICAL DATA:  Fever for 48 hours. EXAM: CHEST - 2 VIEW COMPARISON:  06/25/2018 FINDINGS: Normal heart  size and pulmonary vascularity. No focal airspace disease or consolidation in the lungs. No blunting of costophrenic angles. No pneumothorax. Mediastinal contours appear intact. Degenerative changes in the spine. IMPRESSION: No active cardiopulmonary disease. Electronically Signed   By: Lucienne Capers M.D.   On: 10/02/2018 00:40    Procedures Procedures (including critical care time)  Medications Ordered in ED Medications  cefTRIAXone (ROCEPHIN) injection 1 g (has no administration in time range)  lidocaine (XYLOCAINE) 2 % jelly 1 application (1 application Urethral Given 10/02/18 0232)     Initial Impression / Assessment and Plan / ED Course  I have reviewed the triage vital signs and the nursing notes.  Pertinent labs & imaging results that were available during my care of the patient were reviewed  by me and considered in my medical decision making (see chart for details).     Patient presents to the emergency department for evaluation of fever.  Patient underwent transurethral resection of bladder tumor 3 days ago.  Patient has a Foley catheter in place.  Patient concerned because he has seen some yellow drainage from around the Foley catheter.  He appears well.  No fever here, but he does report taking Tylenol before coming in.  Patient has mild suprapubic tenderness but no signs of peritonitis.  Blood work entirely normal.  Foley catheter was exchanged.  Discussed with Dr. Carlean Purl, on-call for urology.  She has seen the patient in the ER and offered him admission.  He did not want to stay in the hospital, therefore patient will be administered Rocephin and sent home on antibiotics.  Patient's wife has become agitated because of the length of time.  They are demanding to leave immediately, therefore Rocephin administered IM rather than IV.  Will be sent home with prescription for Bactrim which was recommended by Dr. Carlean Purl.  Cultures will be followed.  Final Clinical Impressions(s) /  ED Diagnoses   Final diagnoses:  Postoperative fever    ED Discharge Orders         Ordered    sulfamethoxazole-trimethoprim (BACTRIM DS,SEPTRA DS) 800-160 MG tablet  2 times daily     10/02/18 0325           Orpah Greek, MD 10/02/18 [phone]

## 2018-10-01 NOTE — ED Triage Notes (Signed)
Patient reports intermittent fever and yellow discharge from penis around foley catheter since bladder tumor removal surgery on 11/15. Reports burning in penis.

## 2018-10-02 DIAGNOSIS — R5082 Postprocedural fever: Secondary | ICD-10-CM | POA: Diagnosis not present

## 2018-10-02 DIAGNOSIS — R509 Fever, unspecified: Secondary | ICD-10-CM | POA: Diagnosis not present

## 2018-10-02 LAB — URINALYSIS, ROUTINE W REFLEX MICROSCOPIC
Bacteria, UA: NONE SEEN
Bilirubin Urine: NEGATIVE
Glucose, UA: NEGATIVE mg/dL
KETONES UR: NEGATIVE mg/dL
NITRITE: POSITIVE — AB
PROTEIN: 100 mg/dL — AB
RBC / HPF: >50 RBC/hpf — ABNORMAL HIGH (ref 0–5)
Specific Gravity, Urine: 1.017 (ref 1.005–1.030)
pH: 6 (ref 5.0–8.0)

## 2018-10-02 MED ORDER — SODIUM CHLORIDE 0.9 % IV SOLN: 1.0000 g | Freq: Once | INTRAVENOUS | Status: DC

## 2018-10-02 MED ORDER — CEFTRIAXONE SODIUM 1 G IJ SOLR
1.0000 g | Freq: Once | INTRAMUSCULAR | Status: AC
  Administered 2018-10-02: 1 g via INTRAMUSCULAR
  Filled 2018-10-02: qty 10

## 2018-10-02 MED ORDER — STERILE WATER FOR INJECTION IJ SOLN
INTRAMUSCULAR | Status: AC
  Administered 2018-10-02: 2.1 mL
  Filled 2018-10-02: qty 10

## 2018-10-02 MED ORDER — LIDOCAINE HCL URETHRAL/MUCOSAL 2 % EX GEL
1.0000 "application " | Freq: Once | CUTANEOUS | Status: AC
  Administered 2018-10-02: 1 via URETHRAL
  Filled 2018-10-02: qty 5

## 2018-10-02 MED ORDER — SULFAMETHOXAZOLE-TRIMETHOPRIM 800-160 MG PO TABS: 1.0000 | ORAL_TABLET | Freq: Two times a day (BID) | ORAL | 0 refills | Status: DC

## 2018-10-02 NOTE — ED Notes (Addendum)
Pt's wife very upset with pt not being discharged fast enough or medications being given. Discharge and medications had not been ordered at time of wife's request for RN to administer. Primary RN made aware of wife's concerns and has spoken with pt and wife.

## 2018-10-02 NOTE — ED Notes (Addendum)
When returning to the ED from the floor, pts wife followed this nurse into room 13 stating that they have called out to let someone know he has urinated and nobody has answered. This nurse explained at the time I was out of the department for a moment but heard them call and was going to be there as soon as possible. Pt wife replied that they have been calling for a long time.

## 2018-10-02 NOTE — ED Notes (Addendum)
Spoke to Dr about changing IV antibiotic to IM due to patients family stating they want to leave and do not want to wait for the IV antibiotics.

## 2018-10-02 NOTE — Discharge Instructions (Addendum)
If you continue to have fever, you have worsening abdominal pain or the catheter stops draining, return to the ER.

## 2018-10-02 NOTE — ED Notes (Addendum)
Pts wife approached the nurses station stating "All we are waiting on is medication and nobody is helping Derek May so we will just leave. We have been here all night and nobody has helped Derek May." This nurse explained that the order was placed two minutes ago and will be there as soon as possible. The wife replied "We are just going to go if nobody is going to help Derek May, we will just leave."

## 2018-10-02 NOTE — ED Notes (Signed)
Wife approached nurses station and questioned why no one was "doing anything for the pt". Wife informed of labs that were previously collected and foley catheter that had been removed.

## 2018-10-02 NOTE — ED Notes (Signed)
Pt's wife asked writer how long it would take for pt to be seen and discharged. MD at bedside with patient at this time.

## 2018-10-02 NOTE — Consult Note (Addendum)
Urology Consult Note   Requesting Attending Physician:  Orpah Greek, * Service Providing Consult: Urology  Consulting Attending: Irine Seal, MD  Reason for Consult: Post-operative fever  HPI: Derek May is seen in consultation for reasons noted above at the request of Orpah Greek, * for evaluation of fever after TURBT.  This is a 72 y.o. male with a history of a bladder mass s/p TURBT on 09/28/18. He was admitted post-operatively due to extent of resection and continuous bladder irrigation. Urine cleared nicely and he was discharged on post-operative day 1. He did have an episode of a likely vagal response associated with straining to have a BM prior to discharge.  The patient reports that two nights ago, he had a temperature of 86F and last night, he had a temperature of 101F with associated chills. During the day, he is well appearing. When he had a fever tonight, he took two norco and proceeded to the ED. He has also been experiencing penile discomfort with the foley catheter and has been taking pyridium and norco for this penile pain. He has a small amount of suprapubic discomfort since his TURBT and reports dried blood around the catheter at the penile meatus which was replaced with yellow exudate today.   The patient denies nausea, vomiting, lightheadedness, dizziness, chest pain, shortness of breath. Denies flank pain. Reports he is eating without issue and drinking at home. He is only drinking 5 glasses of fluid a day and reports that this is mostly tea and gatorade. Endorses feeling "bloated" and, given vagal episode prior to previous discharge, has not been attempting to pass gas or have bowel movement.  He reports feeling well since arriving to the ED.   In the ED, catheter was removed and patient voided prior to replacement.  I offered to use an interpreter with the patient and his wife tonight and they declined.   Past Medical History: Past Medical  History:  Diagnosis Date  . Anxiety   . Neuromuscular disorder [facility])     Past Surgical History:  Past Surgical History:  Procedure Laterality Date  . COLONOSCOPY    . TRANSURETHRAL RESECTION OF BLADDER TUMOR N/A 09/28/2018   Procedure: TRANSURETHRAL RESECTION OF BLADDER TUMOR (TURBT);  Surgeon: Lucas Mallow, MD;  Location: [facility];  Service: Urology;  Laterality: N/A;    Medication: No current facility-administered medications for this encounter.    Current Outpatient Medications  Medication Sig Dispense Refill  . Cholecalciferol (VITAMIN D) 50 MCG (2000 UT) CAPS Take 1,000 Units by mouth daily.     Marland Kitchen escitalopram (LEXAPRO) 20 MG tablet Take 20 mg by mouth daily.    Marland Kitchen HYDROcodone-acetaminophen (NORCO) 10-325 MG tablet Take 1-2 tablets by mouth every 4 (four) hours as needed for moderate pain. Maximum dose per 24 hours - 8 pills 12 tablet 0  . LORazepam (ATIVAN) 1 MG tablet Take 1 mg by mouth at bedtime.    . phenazopyridine (PYRIDIUM) 200 MG tablet Take 1 tablet (200 mg total) by mouth 3 (three) times daily as needed for pain. 20 tablet 0  . tolterodine (DETROL LA) 4 MG 24 hr capsule Take 1 capsule (4 mg total) by mouth daily. 10 capsule 0  . HYDROcodone-acetaminophen (NORCO/VICODIN) 5-325 MG tablet Take 1 tablet by mouth every 4 (four) hours as needed for moderate pain. (Patient not taking: Reported on 10/01/2018) 12 tablet 0    Allergies: Allergies  Allergen Reactions  . Ibuprofen Rash    Social History:  Social History   Tobacco Use  . Smoking status: Never Smoker  . Smokeless tobacco: Never Used  Substance Use Topics  . Alcohol use: Never    Frequency: Never  . Drug use: Never    Family History No family history on file.  Review of Systems 10 systems were reviewed and are negative except as noted specifically in the HPI.  Objective   Vital signs in last 24 hours: BP 120/68   Pulse 61   Temp 99.2 F (37.3 C) (Oral)   Resp 14   Ht 5\' 9"  (1.753 m)    Wt 71.2 kg   SpO2 97%   BMI 23.18 kg/m   Physical Exam General: NAD, A&O, resting, appropriate HEENT: /AT, EOMI, MMM Pulmonary: Normal work of breathing Cardiovascular: HDS, adequate peripheral perfusion Abdomen: Soft, non-distended, very mild suprapubic tenderness to palpation. GU: foley catheter in place draining light pink urine (thin and clear), circumcised phallus without drainage around catheter, no CVA tenderness bilaterally Extremities: warm and well perfused Neuro: Appropriate, no focal neurological deficits  Most Recent Labs: Lab Results  Component Value Date   WBC 8.8 10/01/2018   HGB 13.4 10/01/2018   HCT 40.1 10/01/2018   PLT 142 (L) 10/01/2018    Lab Results  Component Value Date   NA 134 (L) 10/01/2018   K 3.9 10/01/2018   CL 99 10/01/2018   CO2 29 10/01/2018   BUN 18 10/01/2018   CREATININE 1.27 (H) 10/01/2018   CALCIUM 8.6 (L) 10/01/2018    No results found for: INR, APTT   IMAGING: Dg Chest 2 View  Result Date: 10/02/2018 CLINICAL DATA:  Fever for 48 hours. EXAM: CHEST - 2 VIEW COMPARISON:  06/25/2018 FINDINGS: Normal heart size and pulmonary vascularity. No focal airspace disease or consolidation in the lungs. No blunting of costophrenic angles. No pneumothorax. Mediastinal contours appear intact. Degenerative changes in the spine. IMPRESSION: No active cardiopulmonary disease. Electronically Signed   By: Lucienne Capers M.D.   On: 10/02/2018 00:40    ------  Assessment:  72 y.o. male with a history of bladder cancer s/p transurethral resection on 09/28/18. Due to extensive resection, patient was admitted overnight for observation and discharged with foley catheter in place. He presents to the ED tonight after having a temperature of 101F with associated chills at home.   The patient is well-appearing on examination with temperature of 37.3C and stable vital signs. He has no leukocytosis and lactate is 0.5. While he has a reported temperature,  he has no signs of significant systemic infection in the ED. UA demonstrates small LE, positive nitrites (on pyridium), > 50 RBCs, 0-5 WBCs, no bacteria. Physical examination benign.   The patient does have a creatinine of 1.2 from 0.9 pre-operatively. Given that he is only drinking 5 glasses of liquid (mostly tea and gatorade), this is likely pre-renal due to dehydration. Discussed increased fluid intake with the patient. However, since patient had significant resection of his multifocal bladder tumor, it is prudent to rule out hydronephrosis in the setting of a reported fever.   I discussed management options with the patient and his wife tonight, including admission for observation to ensure no worsening infection vs discharge home with empiric antibiotics. We discussed the risks and benefits of each option, including the risk of worsening infection, sepsis, and death with discharge. The patient and his wife would strongly like to discharge home with antibiotics, close follow-up, and strict return precautions. Given temperature of 37.3C; reassuring labs, VS, and  exam; and compliant patient, I think this is an acceptable option given patient and his wife express understanding of the risks. We discussed strict return precautions for recurrent fever, nausea, vomiting, lightheadedness, dizziness, catheter not draining, inability to tolerate po intake, and clinical worsening instead of improvement and the patient expressed understanding. He will return to the ED for any of those symptoms.   Recommendations: - Given large TURBT resection defect, continue foley catheter until scheduled follow-up with Dr. Gloriann Loan.  - Administer 2 g ceftriaxone in the ED for empiric broad-spectrum coverage. - Treat empirically with a 7 day course of bactrim.  - Send and follow-up urine culture. Adjust antibiotics appropriately. - Recommend obtaining renal US prior to discharge to rule out hydronephrosis in setting of possible UTI  and large bladder tumor resection.   - Recommended increased fluid intake with water and patient agreed. - Start stool softeners while on narcotic pain medications. Discussed OTC stool softener use with patient and his wife.  - Strict return precautions as above.    Thank you for this consult. Please contact the urology consult pager with any further questions/concerns.

## 2018-10-03 LAB — URINE CULTURE: Culture: NO GROWTH

## 2018-10-09 ENCOUNTER — Other Ambulatory Visit: Payer: Self-pay | Admitting: Urology

## 2018-10-15 DIAGNOSIS — C673 Malignant neoplasm of anterior wall of bladder: Secondary | ICD-10-CM | POA: Diagnosis not present

## 2018-10-30 DIAGNOSIS — C679 Malignant neoplasm of bladder, unspecified: Secondary | ICD-10-CM | POA: Diagnosis not present

## 2018-11-02 ENCOUNTER — Encounter (HOSPITAL_BASED_OUTPATIENT_CLINIC_OR_DEPARTMENT_OTHER): Admission: RE | Payer: Self-pay | Source: Ambulatory Visit

## 2018-11-02 ENCOUNTER — Ambulatory Visit (HOSPITAL_BASED_OUTPATIENT_CLINIC_OR_DEPARTMENT_OTHER): Admission: RE | Admit: 2018-11-02 | Payer: 59 | Source: Ambulatory Visit | Admitting: Urology

## 2018-11-02 SURGERY — TURBT (TRANSURETHRAL RESECTION OF BLADDER TUMOR)
Anesthesia: General

## 2018-11-26 DIAGNOSIS — C678 Malignant neoplasm of overlapping sites of bladder: Secondary | ICD-10-CM | POA: Diagnosis not present

## 2018-11-26 DIAGNOSIS — F332 Major depressive disorder, recurrent severe without psychotic features: Secondary | ICD-10-CM | POA: Diagnosis not present

## 2018-11-26 DIAGNOSIS — R8290 Unspecified abnormal findings in urine: Secondary | ICD-10-CM | POA: Diagnosis not present

## 2018-12-10 DIAGNOSIS — C678 Malignant neoplasm of overlapping sites of bladder: Secondary | ICD-10-CM | POA: Diagnosis not present

## 2018-12-14 DIAGNOSIS — N99110 Postprocedural urethral stricture, male, meatal: Secondary | ICD-10-CM | POA: Diagnosis not present

## 2018-12-14 DIAGNOSIS — R8290 Unspecified abnormal findings in urine: Secondary | ICD-10-CM | POA: Diagnosis not present

## 2018-12-14 DIAGNOSIS — C678 Malignant neoplasm of overlapping sites of bladder: Secondary | ICD-10-CM | POA: Diagnosis not present

## 2018-12-28 DIAGNOSIS — C678 Malignant neoplasm of overlapping sites of bladder: Secondary | ICD-10-CM | POA: Diagnosis not present

## 2019-01-02 DIAGNOSIS — C678 Malignant neoplasm of overlapping sites of bladder: Secondary | ICD-10-CM | POA: Diagnosis not present

## 2019-01-09 DIAGNOSIS — C679 Malignant neoplasm of bladder, unspecified: Secondary | ICD-10-CM | POA: Diagnosis not present

## 2019-01-09 DIAGNOSIS — C678 Malignant neoplasm of overlapping sites of bladder: Secondary | ICD-10-CM | POA: Diagnosis not present

## 2019-01-17 DIAGNOSIS — C679 Malignant neoplasm of bladder, unspecified: Secondary | ICD-10-CM | POA: Diagnosis not present

## 2019-01-17 DIAGNOSIS — C678 Malignant neoplasm of overlapping sites of bladder: Secondary | ICD-10-CM | POA: Diagnosis not present

## 2019-01-17 DIAGNOSIS — D090 Carcinoma in situ of bladder: Secondary | ICD-10-CM | POA: Diagnosis not present

## 2019-01-24 DIAGNOSIS — C678 Malignant neoplasm of overlapping sites of bladder: Secondary | ICD-10-CM | POA: Diagnosis not present

## 2019-01-28 DIAGNOSIS — F332 Major depressive disorder, recurrent severe without psychotic features: Secondary | ICD-10-CM | POA: Diagnosis not present

## 2019-02-06 DIAGNOSIS — C672 Malignant neoplasm of lateral wall of bladder: Secondary | ICD-10-CM | POA: Diagnosis not present

## 2019-02-14 DIAGNOSIS — C678 Malignant neoplasm of overlapping sites of bladder: Secondary | ICD-10-CM | POA: Diagnosis not present

## 2019-02-21 DIAGNOSIS — C678 Malignant neoplasm of overlapping sites of bladder: Secondary | ICD-10-CM | POA: Diagnosis not present

## 2019-02-28 DIAGNOSIS — C678 Malignant neoplasm of overlapping sites of bladder: Secondary | ICD-10-CM | POA: Diagnosis not present

## 2019-02-28 DIAGNOSIS — R399 Unspecified symptoms and signs involving the genitourinary system: Secondary | ICD-10-CM | POA: Diagnosis not present

## 2019-03-08 DIAGNOSIS — C678 Malignant neoplasm of overlapping sites of bladder: Secondary | ICD-10-CM | POA: Diagnosis not present

## 2019-03-08 DIAGNOSIS — Z5112 Encounter for antineoplastic immunotherapy: Secondary | ICD-10-CM | POA: Diagnosis not present

## 2019-03-15 DIAGNOSIS — C678 Malignant neoplasm of overlapping sites of bladder: Secondary | ICD-10-CM | POA: Diagnosis not present

## 2019-04-22 DIAGNOSIS — C679 Malignant neoplasm of bladder, unspecified: Secondary | ICD-10-CM | POA: Diagnosis not present

## 2019-04-22 DIAGNOSIS — F419 Anxiety disorder, unspecified: Secondary | ICD-10-CM | POA: Diagnosis not present

## 2019-04-22 DIAGNOSIS — R05 Cough: Secondary | ICD-10-CM | POA: Diagnosis not present

## 2019-04-29 ENCOUNTER — Other Ambulatory Visit: Payer: Self-pay | Admitting: Family Medicine

## 2019-04-29 DIAGNOSIS — R05 Cough: Secondary | ICD-10-CM

## 2019-04-29 DIAGNOSIS — R059 Cough, unspecified: Secondary | ICD-10-CM

## 2019-04-29 DIAGNOSIS — C679 Malignant neoplasm of bladder, unspecified: Secondary | ICD-10-CM

## 2019-05-03 DIAGNOSIS — C679 Malignant neoplasm of bladder, unspecified: Secondary | ICD-10-CM | POA: Diagnosis not present

## 2019-05-03 DIAGNOSIS — R399 Unspecified symptoms and signs involving the genitourinary system: Secondary | ICD-10-CM | POA: Diagnosis not present

## 2019-05-03 DIAGNOSIS — C678 Malignant neoplasm of overlapping sites of bladder: Secondary | ICD-10-CM | POA: Diagnosis not present

## 2019-05-06 DIAGNOSIS — F332 Major depressive disorder, recurrent severe without psychotic features: Secondary | ICD-10-CM | POA: Diagnosis not present

## 2019-05-13 ENCOUNTER — Other Ambulatory Visit: Payer: Medicare Other

## 2019-05-13 DIAGNOSIS — R399 Unspecified symptoms and signs involving the genitourinary system: Secondary | ICD-10-CM | POA: Diagnosis not present

## 2019-05-13 DIAGNOSIS — C678 Malignant neoplasm of overlapping sites of bladder: Secondary | ICD-10-CM | POA: Diagnosis not present

## 2019-05-15 DIAGNOSIS — Z79899 Other long term (current) drug therapy: Secondary | ICD-10-CM | POA: Diagnosis not present

## 2019-05-15 DIAGNOSIS — Z0000 Encounter for general adult medical examination without abnormal findings: Secondary | ICD-10-CM | POA: Diagnosis not present

## 2019-05-15 DIAGNOSIS — E559 Vitamin D deficiency, unspecified: Secondary | ICD-10-CM | POA: Diagnosis not present

## 2019-05-16 ENCOUNTER — Ambulatory Visit
Admission: RE | Admit: 2019-05-16 | Discharge: 2019-05-16 | Disposition: A | Discharge status: Home / Self Care | Payer: 59 | Source: Ambulatory Visit | Attending: Family Medicine | Admitting: Family Medicine

## 2019-05-16 ENCOUNTER — Other Ambulatory Visit: Payer: Self-pay

## 2019-05-16 DIAGNOSIS — C679 Malignant neoplasm of bladder, unspecified: Secondary | ICD-10-CM

## 2019-05-16 DIAGNOSIS — J984 Other disorders of lung: Secondary | ICD-10-CM | POA: Diagnosis not present

## 2019-05-16 DIAGNOSIS — Z8551 Personal history of malignant neoplasm of bladder: Secondary | ICD-10-CM | POA: Diagnosis not present

## 2019-05-16 DIAGNOSIS — R059 Cough, unspecified: Secondary | ICD-10-CM

## 2019-05-16 DIAGNOSIS — R05 Cough: Secondary | ICD-10-CM

## 2019-05-20 DIAGNOSIS — R413 Other amnesia: Secondary | ICD-10-CM | POA: Diagnosis not present

## 2019-05-20 DIAGNOSIS — Z0000 Encounter for general adult medical examination without abnormal findings: Secondary | ICD-10-CM | POA: Diagnosis not present

## 2019-05-20 DIAGNOSIS — Z23 Encounter for immunization: Secondary | ICD-10-CM | POA: Diagnosis not present

## 2019-05-20 DIAGNOSIS — R05 Cough: Secondary | ICD-10-CM | POA: Diagnosis not present

## 2019-06-19 DIAGNOSIS — C678 Malignant neoplasm of overlapping sites of bladder: Secondary | ICD-10-CM | POA: Diagnosis not present

## 2019-06-19 DIAGNOSIS — N401 Enlarged prostate with lower urinary tract symptoms: Secondary | ICD-10-CM | POA: Diagnosis not present

## 2019-07-01 DIAGNOSIS — C678 Malignant neoplasm of overlapping sites of bladder: Secondary | ICD-10-CM | POA: Diagnosis not present

## 2019-07-01 DIAGNOSIS — R399 Unspecified symptoms and signs involving the genitourinary system: Secondary | ICD-10-CM | POA: Diagnosis not present

## 2019-08-05 IMAGING — CT CT CHEST WITHOUT CONTRAST
2 of 4 series · 12 of 36 positions shown, 15 images · non-contrast
Comparison: Chest radiograph 10/02/2018

CLINICAL DATA: Patient with history of bladder carcinoma. Evaluate
for metastatic disease.

EXAM:
CT CHEST WITHOUT CONTRAST
TECHNIQUE: Multidetector CT imaging of the chest was performed following the
standard protocol without IV contrast.

[Series 2: chest 2.00 br40 s3 · axial · 0.52mm/px · z∈[+1440,+1724]mm · 9 of 166 slices shown, 12 images (1 of 2)]
[im 12/166  mediastinal]
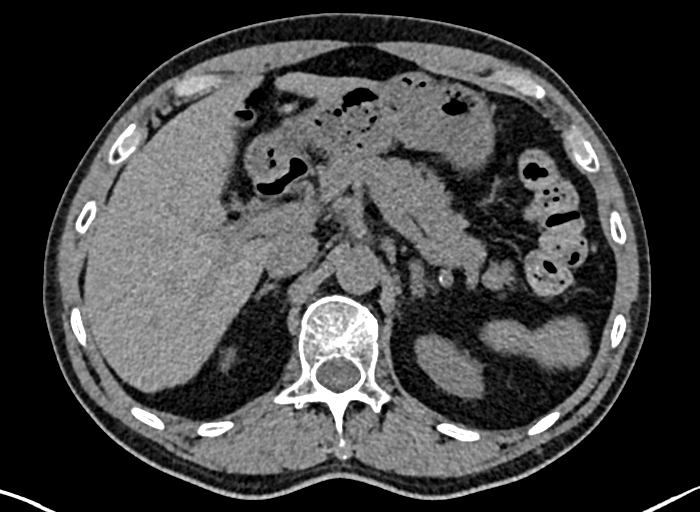
[im 12/166  lung]
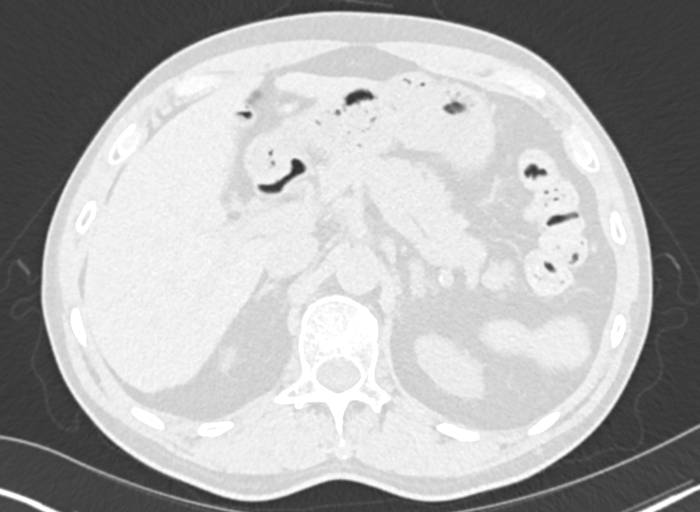
[im 36/166  lung]
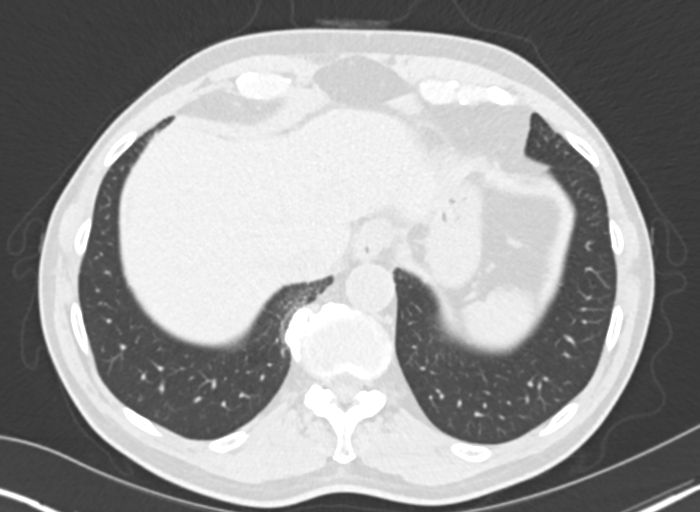
[im 48/166  lung]
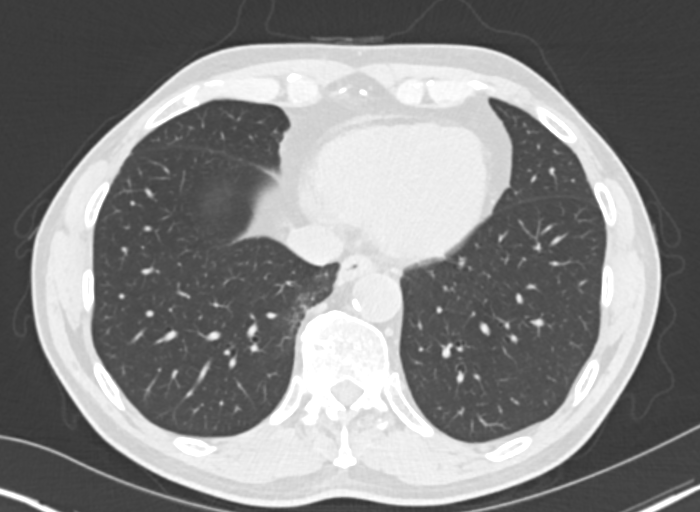
[im 71/166  lung]
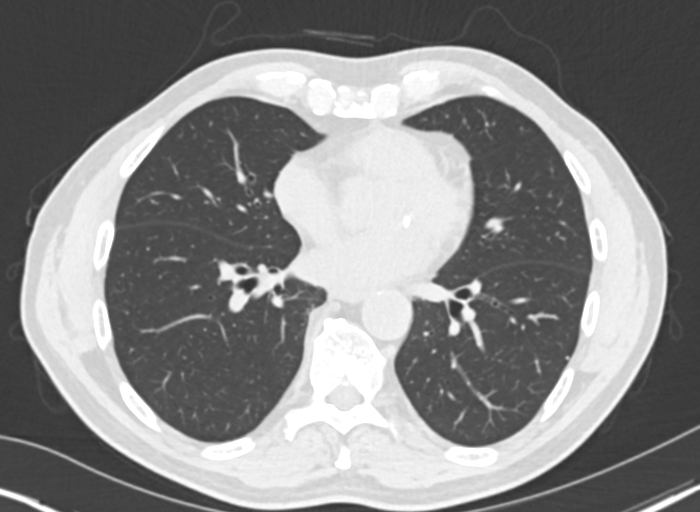
[im 83/166  mediastinal]
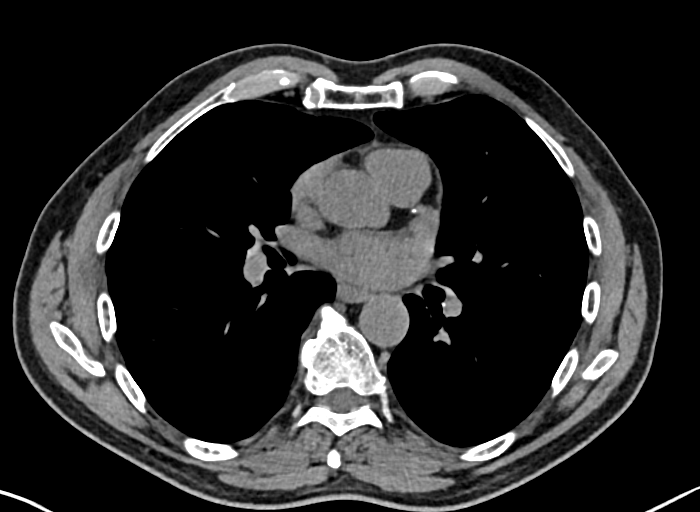
[im 83/166  lung]
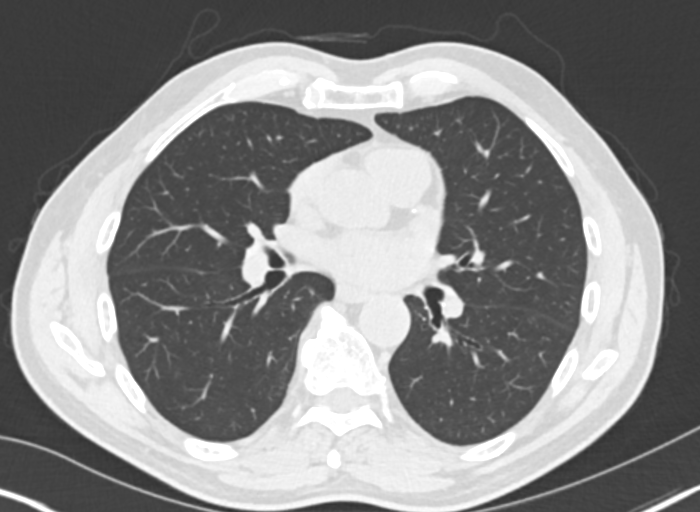
[im 95/166  lung]
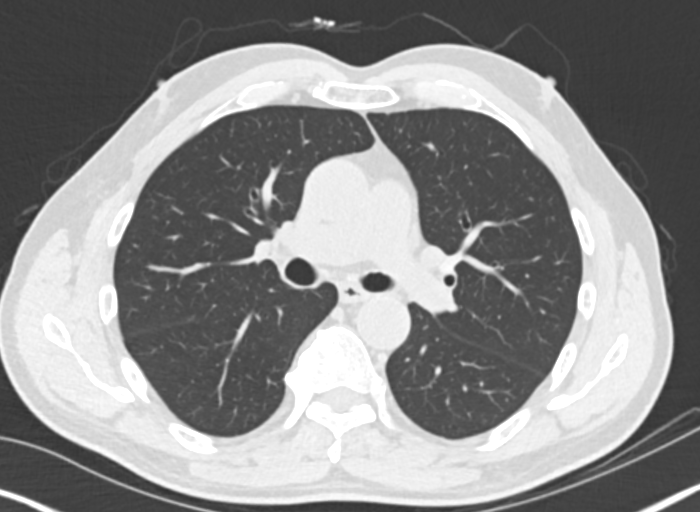
[im 118/166  lung]
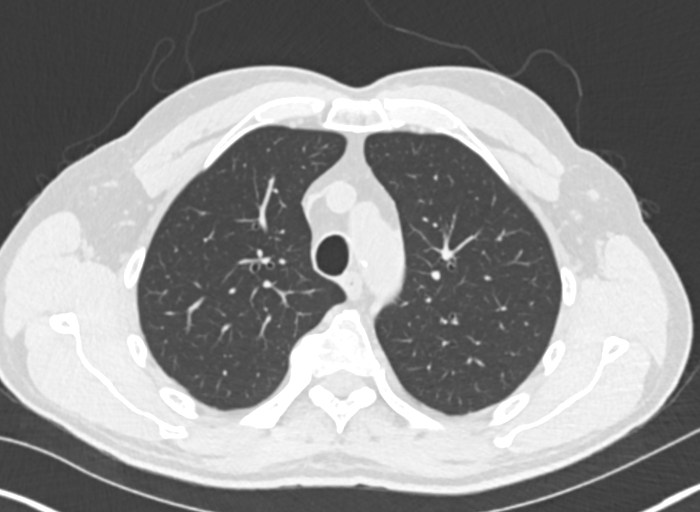
[im 130/166  lung]
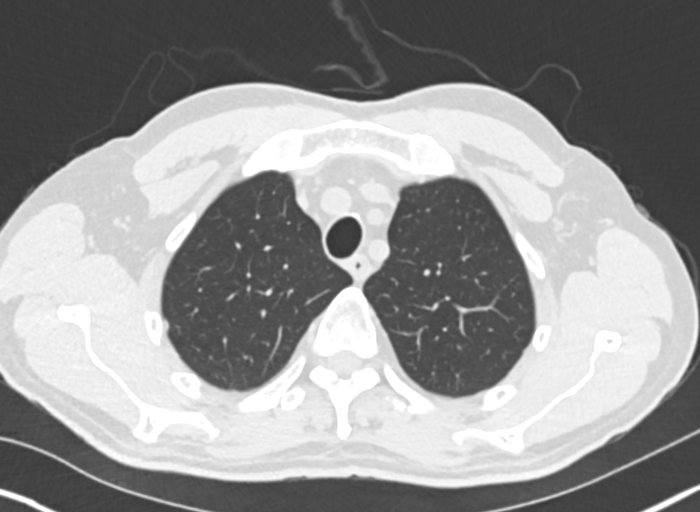
[im 154/166  mediastinal]
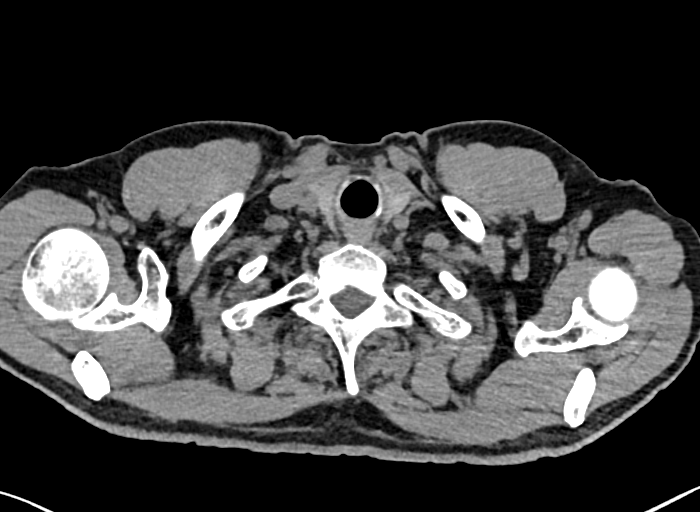
[im 154/166  lung]
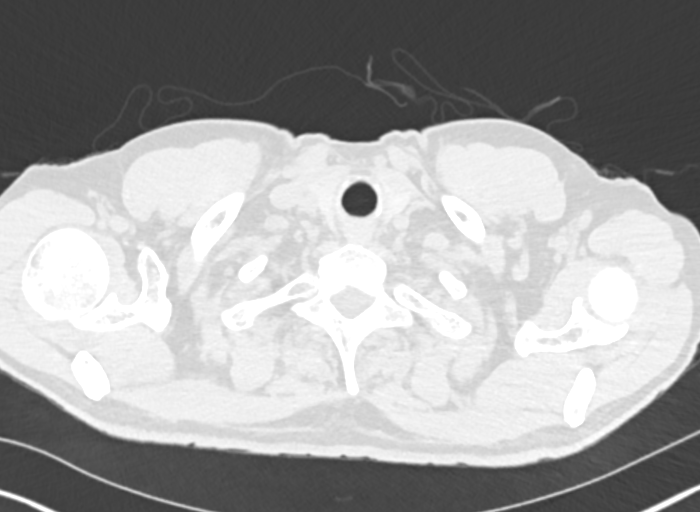

[Series 4: chest 2.00 br40 s3 · coronal · 0.65mm/px · 3 of 131 slices shown (2 of 2)]
[im 27/131  lung]
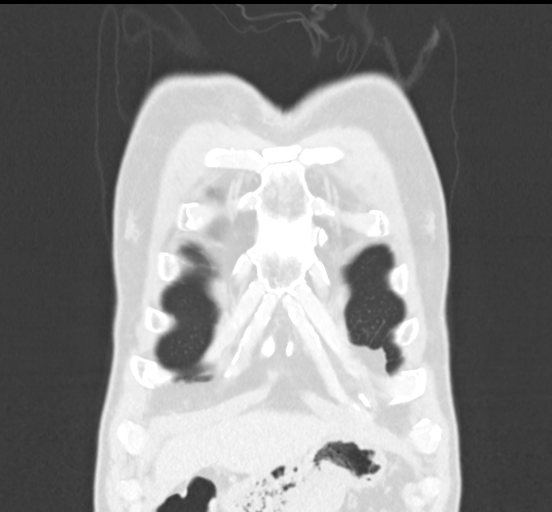
[im 53/131  lung]
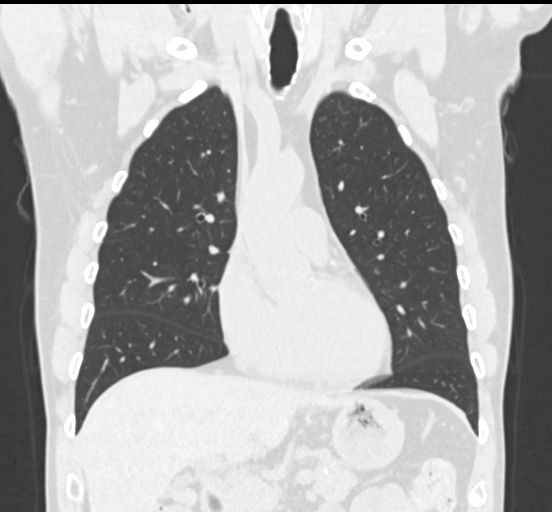
[im 79/131  lung]
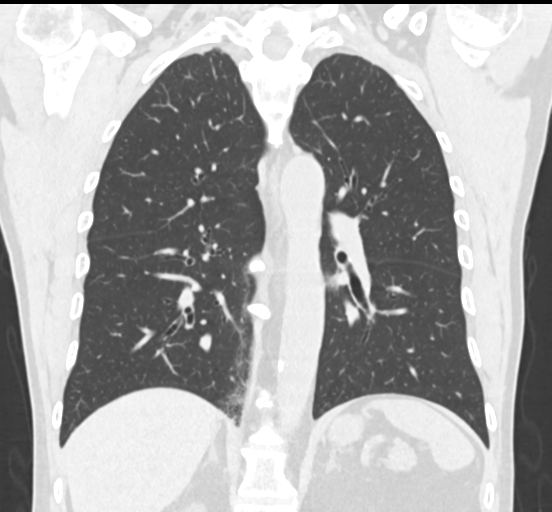

[12 of 36 positions shown; findings below may reference images not displayed]

FINDINGS: Cardiovascular: Normal heart size. Coronary arterial and thoracic
aortic vascular calcifications. Trace fluid superior pericardial
recess. Aorta and main pulmonary artery normal in caliber.

Mediastinum/Nodes: No enlarged axillary, mediastinal or hilar
lymphadenopathy. Normal appearance of the esophagus.

Lungs/Pleura: Central airways are patent. Minimal subpleural
scarring medial right lower lobe. No large area pulmonary
consolidation. No pleural effusion or pneumothorax.

Upper Abdomen: No acute process.

Musculoskeletal: Midthoracic spine degenerative changes. No
aggressive or acute appearing osseous lesions.
IMPRESSION: 1. No acute process within the chest.
2. No CT evidence to suggest metastatic disease within the chest.

## 2019-08-20 DIAGNOSIS — Z23 Encounter for immunization: Secondary | ICD-10-CM | POA: Diagnosis not present

## 2019-09-02 DIAGNOSIS — F332 Major depressive disorder, recurrent severe without psychotic features: Secondary | ICD-10-CM | POA: Diagnosis not present

## 2019-09-05 DIAGNOSIS — C678 Malignant neoplasm of overlapping sites of bladder: Secondary | ICD-10-CM | POA: Diagnosis not present

## 2019-11-28 DIAGNOSIS — C673 Malignant neoplasm of anterior wall of bladder: Secondary | ICD-10-CM | POA: Diagnosis not present

## 2019-12-05 DIAGNOSIS — C678 Malignant neoplasm of overlapping sites of bladder: Secondary | ICD-10-CM | POA: Diagnosis not present

## 2019-12-05 DIAGNOSIS — C673 Malignant neoplasm of anterior wall of bladder: Secondary | ICD-10-CM | POA: Diagnosis not present

## 2019-12-05 DIAGNOSIS — C679 Malignant neoplasm of bladder, unspecified: Secondary | ICD-10-CM | POA: Diagnosis not present

## 2020-01-30 DIAGNOSIS — F332 Major depressive disorder, recurrent severe without psychotic features: Secondary | ICD-10-CM | POA: Diagnosis not present

## 2020-03-30 DIAGNOSIS — C673 Malignant neoplasm of anterior wall of bladder: Secondary | ICD-10-CM | POA: Diagnosis not present

## 2020-03-30 DIAGNOSIS — N3289 Other specified disorders of bladder: Secondary | ICD-10-CM | POA: Diagnosis not present

## 2020-03-30 DIAGNOSIS — R399 Unspecified symptoms and signs involving the genitourinary system: Secondary | ICD-10-CM | POA: Diagnosis not present

## 2020-04-15 DIAGNOSIS — N529 Male erectile dysfunction, unspecified: Secondary | ICD-10-CM | POA: Diagnosis not present

## 2020-04-15 DIAGNOSIS — R05 Cough: Secondary | ICD-10-CM | POA: Diagnosis not present

## 2020-04-15 DIAGNOSIS — B356 Tinea cruris: Secondary | ICD-10-CM | POA: Diagnosis not present

## 2020-05-01 DIAGNOSIS — N401 Enlarged prostate with lower urinary tract symptoms: Secondary | ICD-10-CM | POA: Diagnosis not present

## 2020-05-01 DIAGNOSIS — N3281 Overactive bladder: Secondary | ICD-10-CM | POA: Diagnosis not present

## 2020-05-01 DIAGNOSIS — C673 Malignant neoplasm of anterior wall of bladder: Secondary | ICD-10-CM | POA: Diagnosis not present

## 2020-05-11 DIAGNOSIS — Z8249 Family history of ischemic heart disease and other diseases of the circulatory system: Secondary | ICD-10-CM | POA: Diagnosis not present

## 2020-05-11 DIAGNOSIS — Z886 Allergy status to analgesic agent status: Secondary | ICD-10-CM | POA: Diagnosis not present

## 2020-05-11 DIAGNOSIS — N529 Male erectile dysfunction, unspecified: Secondary | ICD-10-CM | POA: Diagnosis not present

## 2020-05-11 DIAGNOSIS — Z833 Family history of diabetes mellitus: Secondary | ICD-10-CM | POA: Diagnosis not present

## 2020-05-11 DIAGNOSIS — F419 Anxiety disorder, unspecified: Secondary | ICD-10-CM | POA: Diagnosis not present

## 2020-05-11 DIAGNOSIS — N3281 Overactive bladder: Secondary | ICD-10-CM | POA: Diagnosis not present

## 2020-05-11 DIAGNOSIS — N400 Enlarged prostate without lower urinary tract symptoms: Secondary | ICD-10-CM | POA: Diagnosis not present

## 2020-05-11 DIAGNOSIS — R69 Illness, unspecified: Secondary | ICD-10-CM | POA: Diagnosis not present

## 2020-05-11 DIAGNOSIS — C679 Malignant neoplasm of bladder, unspecified: Secondary | ICD-10-CM | POA: Diagnosis not present

## 2020-05-12 DIAGNOSIS — C673 Malignant neoplasm of anterior wall of bladder: Secondary | ICD-10-CM | POA: Diagnosis not present

## 2020-05-12 DIAGNOSIS — R3989 Other symptoms and signs involving the genitourinary system: Secondary | ICD-10-CM | POA: Diagnosis not present

## 2020-05-26 DIAGNOSIS — R399 Unspecified symptoms and signs involving the genitourinary system: Secondary | ICD-10-CM | POA: Diagnosis not present

## 2020-05-26 DIAGNOSIS — C673 Malignant neoplasm of anterior wall of bladder: Secondary | ICD-10-CM | POA: Diagnosis not present

## 2020-09-30 ENCOUNTER — Ambulatory Visit (INDEPENDENT_AMBULATORY_CARE_PROVIDER_SITE_OTHER): Payer: Medicare Other

## 2020-09-30 ENCOUNTER — Other Ambulatory Visit (INDEPENDENT_AMBULATORY_CARE_PROVIDER_SITE_OTHER): Payer: Medicare Other

## 2020-09-30 ENCOUNTER — Encounter: Payer: Self-pay | Admitting: Pulmonary Disease

## 2020-09-30 ENCOUNTER — Other Ambulatory Visit: Payer: Self-pay | Admitting: Pulmonary Disease

## 2020-09-30 ENCOUNTER — Other Ambulatory Visit: Payer: Self-pay

## 2020-09-30 ENCOUNTER — Ambulatory Visit (INDEPENDENT_AMBULATORY_CARE_PROVIDER_SITE_OTHER): Payer: Medicare Other | Admitting: Pulmonary Disease

## 2020-09-30 ENCOUNTER — Telehealth: Payer: Self-pay | Admitting: Pulmonary Disease

## 2020-09-30 VITALS — BP 126/66 | HR 70 | Temp 97.6°F | Ht 70.0 in | Wt 157.0 lb

## 2020-09-30 DIAGNOSIS — R0602 Shortness of breath: Secondary | ICD-10-CM

## 2020-09-30 LAB — CBC WITH DIFFERENTIAL/PLATELET
Basophils Absolute: 0 10*3/uL (ref 0.0–0.1)
Basophils Relative: 0.7 % (ref 0.0–3.0)
Eosinophils Absolute: 0.4 10*3/uL (ref 0.0–0.7)
Eosinophils Relative: 6.8 % — ABNORMAL HIGH (ref 0.0–5.0)
HCT: 44.5 % (ref 39.0–52.0)
Hemoglobin: 15.2 g/dL (ref 13.0–17.0)
Lymphocytes Relative: 28.2 % (ref 12.0–46.0)
Lymphs Abs: 1.7 10*3/uL (ref 0.7–4.0)
MCHC: 34.2 g/dL (ref 30.0–36.0)
MCV: 90 fl (ref 78.0–100.0)
Monocytes Absolute: 0.8 10*3/uL (ref 0.1–1.0)
Monocytes Relative: 13.9 % — ABNORMAL HIGH (ref 3.0–12.0)
Neutro Abs: 3 10*3/uL (ref 1.4–7.7)
Neutrophils Relative %: 50.4 % (ref 43.0–77.0)
Platelets: 200 10*3/uL (ref 150.0–400.0)
RBC: 4.95 Mil/uL (ref 4.22–5.81)
RDW: 12.5 % (ref 11.5–15.5)
WBC: 6 10*3/uL (ref 4.0–10.5)

## 2020-09-30 LAB — BASIC METABOLIC PANEL
BUN: 17 mg/dL (ref 6–23)
CO2: 31 mEq/L (ref 19–32)
Calcium: 9.3 mg/dL (ref 8.4–10.5)
Chloride: 101 mEq/L (ref 96–112)
Creatinine, Ser: 1.02 mg/dL (ref 0.40–1.50)
GFR: 72.69 mL/min (ref >60.00)
Glucose, Bld: 69 mg/dL — ABNORMAL LOW (ref 70–99)
Potassium: 4.6 mEq/L (ref 3.5–5.1)
Sodium: 135 mEq/L (ref 135–145)

## 2020-09-30 MED ORDER — LEVOFLOXACIN 750 MG PO TABS: 750.0000 mg | ORAL_TABLET | Freq: Every day | ORAL | 0 refills | Status: DC

## 2020-09-30 MED ORDER — AMOXICILLIN-POT CLAVULANATE 875-125 MG PO TABS: 1.0000 | ORAL_TABLET | Freq: Two times a day (BID) | ORAL | 0 refills | Status: DC

## 2020-09-30 NOTE — Telephone Encounter (Signed)
Called and spoke with patient, advised the patient of the change in the antibiotic.  Advised not to take the Levaquin and to take the Augmentin that has been sent to the local [facility].  Advised to take twice a day with food.  He verbalized understanding.  I called [facility] and cancelled the prescription for the Augmentin as it was sent to the mail order pharmacy and he needs it now.  Nothing further needed.

## 2020-09-30 NOTE — Progress Notes (Signed)
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 Cholecalciferol (VITAMIN D) 50 MCG (2000 UT) CAPS Take 1,000 Units by mouth daily.   Marland Kitchen escitalopram (LEXAPRO) 20 MG tablet Take 20 mg by mouth daily.  Marland Kitchen HYDROcodone-acetaminophen (NORCO) 10-325 MG tablet Take 1-2 tablets by mouth every 4 (four) hours as needed for moderate pain. Maximum dose per 24 hours - 8 pills  . LORazepam (ATIVAN) 1 MG tablet Take 1 mg by mouth at bedtime.  . phenazopyridine (PYRIDIUM) 200 MG tablet Take 1 tablet (200 mg total) by mouth 3 (three) times daily as needed for pain.  Marland Kitchen sulfamethoxazole-trimethoprim (BACTRIM DS,SEPTRA DS) 800-160 MG tablet Take 1 tablet by mouth 2 (two) times daily.  Marland Kitchen tolterodine (DETROL LA) 4 MG 24 hr capsule Take 1 capsule (4 mg total) by mouth daily.  Marland Kitchen levofloxacin (LEVAQUIN) 750 MG tablet Take 1 tablet (750 mg total) by mouth daily.   No  facility-administered encounter medications on file as of 09/30/2020.    Allergies as of 09/30/2020 - Review Complete 09/30/2020  Allergen Reaction Noted  . Ibuprofen Rash 09/27/2018    Past Medical History:  Diagnosis Date  . Anxiety   . Neuromuscular disorder [facility])     Past Surgical History:  Procedure Laterality Date  . COLONOSCOPY    . TRANSURETHRAL RESECTION OF BLADDER TUMOR N/A 09/28/2018   Procedure: TRANSURETHRAL RESECTION OF BLADDER TUMOR (TURBT);  Surgeon: Lucas Mallow, MD;  Location: [facility];  Service: Urology;  Laterality: N/A;    No family history on file.  Social History   Socioeconomic History  . Marital status: Married    Spouse name: Not on file  . Number of children: Not on file  . Years of education: Not on file  . Highest education level: Not on file  Occupational History  . Not on file  Tobacco Use  . Smoking status: Never Smoker  . Smokeless tobacco: Never Used  Vaping Use  . Vaping Use: Never used  Substance and Sexual Activity  . Alcohol use: Never  . Drug use: Never  . Sexual activity: Not on file  Other Topics Concern  . Not on file  Social History Narrative  . Not on file   Social Determinants of Health   Financial Resource Strain:   . Difficulty of Paying Living Expenses: Not on file  Food Insecurity:   . Worried About [employer] of  Food in the Last Year: Not on file  . Ran Out of Food in the Last Year: Not on file  Transportation Needs:   . Lack of Transportation (Medical): Not on file  . Lack of Transportation (Non-Medical): Not on file  Physical Activity:   . Days of Exercise per Week: Not on file  . Minutes of Exercise per Session: Not on file  Stress:   . Feeling of Stress : Not on file  Social Connections:   . Frequency of Communication with Friends and Family: Not on file  . Frequency of Social Gatherings with Friends and Family: Not on file  . Attends Religious Services: Not on file  . Active Member of Clubs  or Organizations: Not on file  . Attends Archivist Meetings: Not on file  . Marital Status: Not on file  Intimate Partner Violence:   . Fear of Current or Ex-Partner: Not on file  . Emotionally Abused: Not on file  . Physically Abused: Not on file  . Sexually Abused: Not on file    Review of Systems  Constitutional: Positive for fatigue.  Respiratory: Positive for cough and shortness of breath.   Psychiatric/Behavioral: Positive for dysphoric mood.    Vitals:   09/30/20 1035  BP: 126/66  Pulse: 70  Temp: 97.6 F (36.4 C)  SpO2: 98%     Physical Exam Constitutional:      Appearance: Normal appearance.  HENT:     Nose: No congestion or rhinorrhea.     Mouth/Throat:     Pharynx: No oropharyngeal exudate.  Eyes:     General:        Right eye: No discharge.        Left eye: No discharge.  Cardiovascular:     Rate and Rhythm: Normal rate and regular rhythm.     Heart sounds: No murmur heard.  No friction rub. No gallop.   Pulmonary:     Effort: No respiratory distress.     Breath sounds: No stridor. No wheezing or rhonchi.  Musculoskeletal:     Cervical back: No rigidity or tenderness.  Neurological:     Mental Status: He is alert.  Psychiatric:        Mood and Affect: Mood normal.    Data Reviewed: CT scan from 2020 showed no abnormality in his lungs, had some pleural thickening Had a CT scan of the abdomen in January 2021-stable findings from previous from his previous treatment for bladder cancer  Assessment:  Bronchitis/possible pneumonia  Chronic shortness of breath and cough  History of bladder cancer  His symptoms of shortness of breath was significantly exacerbated following receiving his flu shot, fatigue and fever at present  Plan/Recommendations: Obtain chest x-ray CBC and Chem-7 Course of Levaquin  I will see him back in about 2 to 3 weeks -If he is much better out of the time then we will plan for a pulmonary function test and  possible CT scan of the chest as indicated at the time -Occupational predisposition to lung disease  -More pressing need for treatment for what appears to be a lower respiratory tract infection at present   Sherrilyn Rist MD [facility] 09/30/2020, 11:03 AM  CC: Lujean Amel, MD

## 2020-09-30 NOTE — Telephone Encounter (Signed)
Regarding message Levaquin and tendon problems  Discontinue Levaquin  Augmentin prescription sent in place of Levaquin

## 2020-09-30 NOTE — Telephone Encounter (Signed)
Called and spoke with patient, he states he already has a tendon problem in both of his shoulders.  He was prescribed Levofloxacin and read that it can cause problems with tendons and wants to know if it is safe for him to take this medication.  Dr. Ander Slade, please advise.  Thank you.

## 2020-09-30 NOTE — Patient Instructions (Signed)
Shortness of breath, cough of couple years duration -Further evaluation once we treat you acute problems -Bronchitis  -Plan will be to do a breathing study, possible CT scan of the chest   For your current symptoms, may have a breathing tube infection  -Course of antibiotics, Levaquin 750 daily for 5 days  -Will will get a chest x-ray, get blood work-cbc, bmp  -I will see you back in 2 to 3 weeks  Call with significant concerns

## 2020-09-30 NOTE — Telephone Encounter (Signed)
I will switch antibiotic to Augmentin

## 2020-10-06 ENCOUNTER — Telehealth: Payer: Self-pay | Admitting: Pulmonary Disease

## 2020-10-06 NOTE — Telephone Encounter (Signed)
Called and spoke to pt. Pt is requesting lab work and CXR from 09/30/20.   Dr. Ander Slade, please advise on results. Thanks.

## 2020-10-06 NOTE — Telephone Encounter (Signed)
Chest x-ray did not reveal any abnormality  Blood work within normal limits

## 2020-10-07 NOTE — Telephone Encounter (Signed)
Called and spoke to pt. Informed him of the results per AO. Pt verbalized understanding and denied any further questions or concerns at this time.

## 2020-10-23 ENCOUNTER — Encounter: Payer: Self-pay | Admitting: Pulmonary Disease

## 2020-10-23 ENCOUNTER — Ambulatory Visit (INDEPENDENT_AMBULATORY_CARE_PROVIDER_SITE_OTHER): Payer: Medicare Other | Admitting: Pulmonary Disease

## 2020-10-23 ENCOUNTER — Other Ambulatory Visit: Payer: Self-pay

## 2020-10-23 VITALS — BP 110/60 | HR 60 | Temp 97.4°F | Ht 70.0 in | Wt 157.2 lb

## 2020-10-23 DIAGNOSIS — R0602 Shortness of breath: Secondary | ICD-10-CM

## 2020-10-23 NOTE — Progress Notes (Signed)
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 Cholecalciferol (VITAMIN D) 50 MCG (2000 UT) CAPS Take 1,000 Units by mouth daily.   Marland Kitchen escitalopram (LEXAPRO) 20 MG tablet Take 20 mg by mouth daily.  Marland Kitchen LORazepam (ATIVAN) 1 MG tablet Take 1 mg by mouth at bedtime.  . tolterodine (DETROL LA) 4 MG 24 hr capsule Take 1 capsule (4 mg total) by mouth daily.  . Zinc 100 MG TABS Take 1 tablet by mouth daily.  . [DISCONTINUED] amoxicillin-clavulanate (AUGMENTIN) 875-125 MG tablet Take 1 tablet by mouth 2 (two) times daily. (Patient not taking: Reported on 10/23/2020)  . [DISCONTINUED] HYDROcodone-acetaminophen (NORCO) 10-325 MG tablet Take 1-2 tablets by mouth every 4 (four) hours as needed for moderate pain. Maximum dose per 24 hours - 8 pills (Patient not taking: Reported on 10/23/2020)  . [DISCONTINUED] phenazopyridine  (PYRIDIUM) 200 MG tablet Take 1 tablet (200 mg total) by mouth 3 (three) times daily as needed for pain. (Patient not taking: Reported on 10/23/2020)  . [DISCONTINUED] sulfamethoxazole-trimethoprim (BACTRIM DS,SEPTRA DS) 800-160 MG tablet Take 1 tablet by mouth 2 (two) times daily. (Patient not taking: Reported on 10/23/2020)  . [DISCONTINUED] tolterodine (DETROL LA) 2 MG 24 hr capsule  (Patient not taking: Reported on 10/23/2020)   No facility-administered encounter medications on file as of 10/23/2020.    Allergies as of 10/23/2020 - Review Complete 10/23/2020  Allergen Reaction Noted  . Ibuprofen Rash 09/27/2018    Past Medical History:  Diagnosis Date  . Anxiety   . Neuromuscular disorder [facility])     Past Surgical History:  Procedure Laterality Date  . COLONOSCOPY    . TRANSURETHRAL RESECTION OF BLADDER TUMOR N/A 09/28/2018   Procedure: TRANSURETHRAL RESECTION OF BLADDER TUMOR (TURBT);  Surgeon: Lucas Mallow, MD;  Location: [facility];  Service: Urology;  Laterality: N/A;    No family history on file.  Social History   Socioeconomic History  . Marital status: Married    Spouse name: Not on file  . Number of children: Not on file  . Years of education: Not on file  . Highest education level: Not on file  Occupational History  . Not on file  Tobacco Use  . Smoking status: Never Smoker  . Smokeless tobacco: Never Used  Vaping Use  . Vaping Use: Never used  Substance and Sexual Activity  .  Alcohol use: Never  . Drug use: Never  . Sexual activity: Not on file  Other Topics Concern  . Not on file  Social History Narrative  . Not on file   Social Determinants of Health   Financial Resource Strain: Not on file  Food Insecurity: Not on file  Transportation Needs: Not on file  Physical Activity: Not on file  Stress: Not on file  Social Connections: Not on file  Intimate Partner Violence: Not on file    Review of Systems  Constitutional: Negative for fatigue.   Respiratory: Positive for cough. Negative for shortness of breath.   Psychiatric/Behavioral: Positive for dysphoric mood.    Vitals:   10/23/20 1557  BP: 110/60  Pulse: 60  Temp: (!) 97.4 F (36.3 C)  SpO2: 98%     Physical Exam Constitutional:      Appearance: Normal appearance.  HENT:     Nose: No congestion or rhinorrhea.     Mouth/Throat:     Pharynx: No oropharyngeal exudate.  Eyes:     General:        Right eye: No discharge.        Left eye: No discharge.  Cardiovascular:     Rate and Rhythm: Normal rate and regular rhythm.     Heart sounds: No murmur heard. No friction rub. No gallop.   Pulmonary:     Effort: No respiratory distress.     Breath sounds: No stridor. No wheezing or rhonchi.  Musculoskeletal:     Cervical back: No rigidity or tenderness.  Neurological:     Mental Status: He is alert.  Psychiatric:        Mood and Affect: Mood normal.    Data Reviewed: CT scan from 2020 showed no abnormality in his lungs, had some pleural thickening Had a CT scan of the abdomen in January 2021-stable findings from previous from his previous treatment for bladder cancer Recent CBC and Chem-7 within normal limits  Chest x-ray with no acute infiltrate  Assessment:  Bronchitis/possible pneumonia -Treated with a course of antibiotics -Symptoms are significantly improved Cough is better  Chronic shortness of breath and cough -Improved symptoms with antibiotics recently  History of bladder cancer   Plan/Recommendations:  With significantly improved symptoms will see him just as needed  Encouraged to call if he has any significant concerns    Sherrilyn Rist MD [facility] 10/23/2020, 4:06 PM  CC: Lujean Amel, MD

## 2020-10-23 NOTE — Patient Instructions (Signed)
Glad you are feeling much better  The cough and from a recent infection does sometimes take a few weeks to resolve, may take 4 to 6 weeks  I will see you back only as needed  Call with significant concerns

## 2020-11-23 DIAGNOSIS — Z03818 Encounter for observation for suspected exposure to other biological agents ruled out: Secondary | ICD-10-CM | POA: Diagnosis not present

## 2020-11-23 DIAGNOSIS — R051 Acute cough: Secondary | ICD-10-CM | POA: Diagnosis not present

## 2020-11-23 DIAGNOSIS — R6889 Other general symptoms and signs: Secondary | ICD-10-CM | POA: Diagnosis not present

## 2020-11-26 DIAGNOSIS — R6883 Chills (without fever): Secondary | ICD-10-CM | POA: Diagnosis not present

## 2020-12-09 DIAGNOSIS — C673 Malignant neoplasm of anterior wall of bladder: Secondary | ICD-10-CM | POA: Diagnosis not present

## 2020-12-20 IMAGING — DX DG CHEST 2V
2 series · 2 of 2 positions shown · non-contrast
Comparison: 10/02/2018

CLINICAL DATA: Dyspnea

EXAM:
CHEST - 2 VIEW

[chest pa]
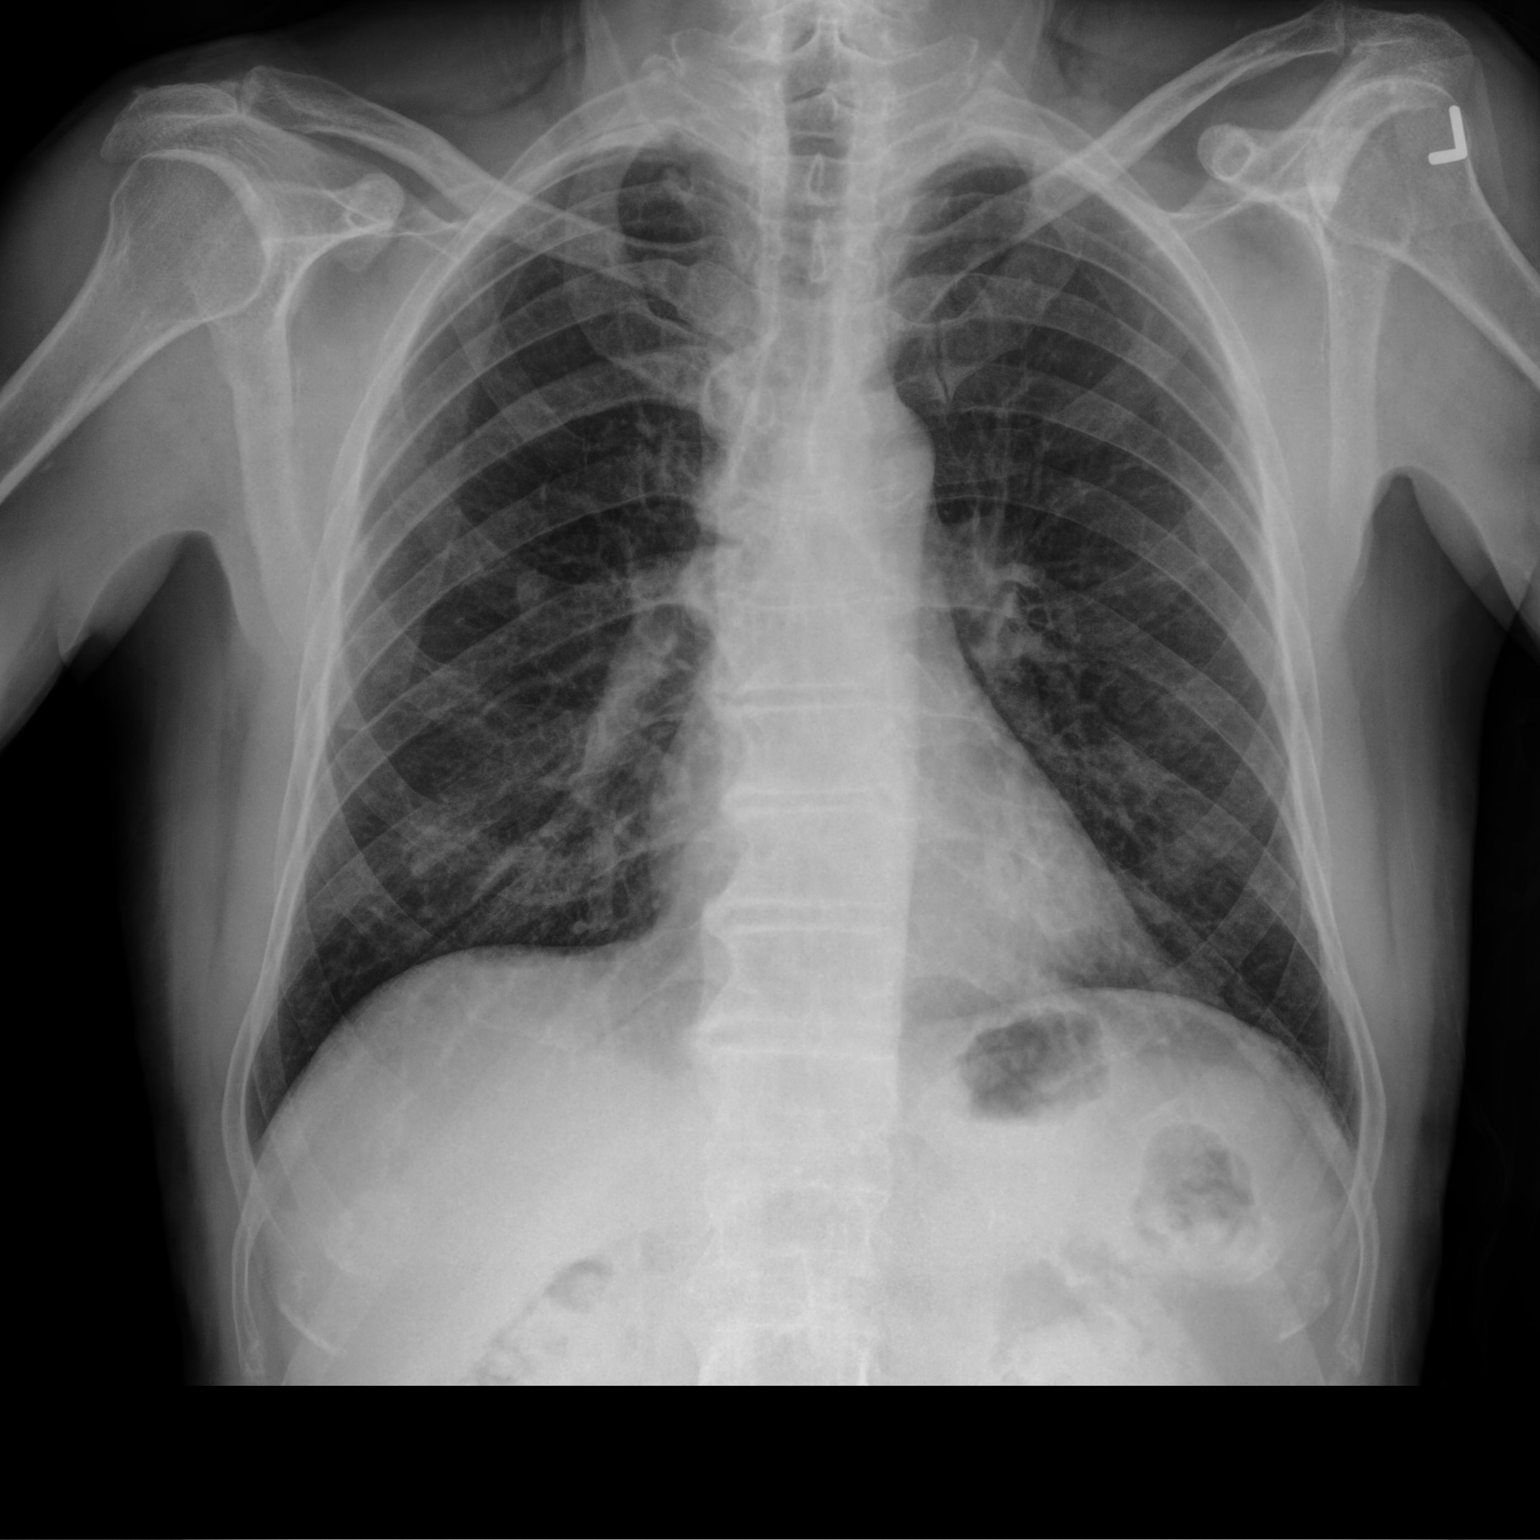

[chest lat]
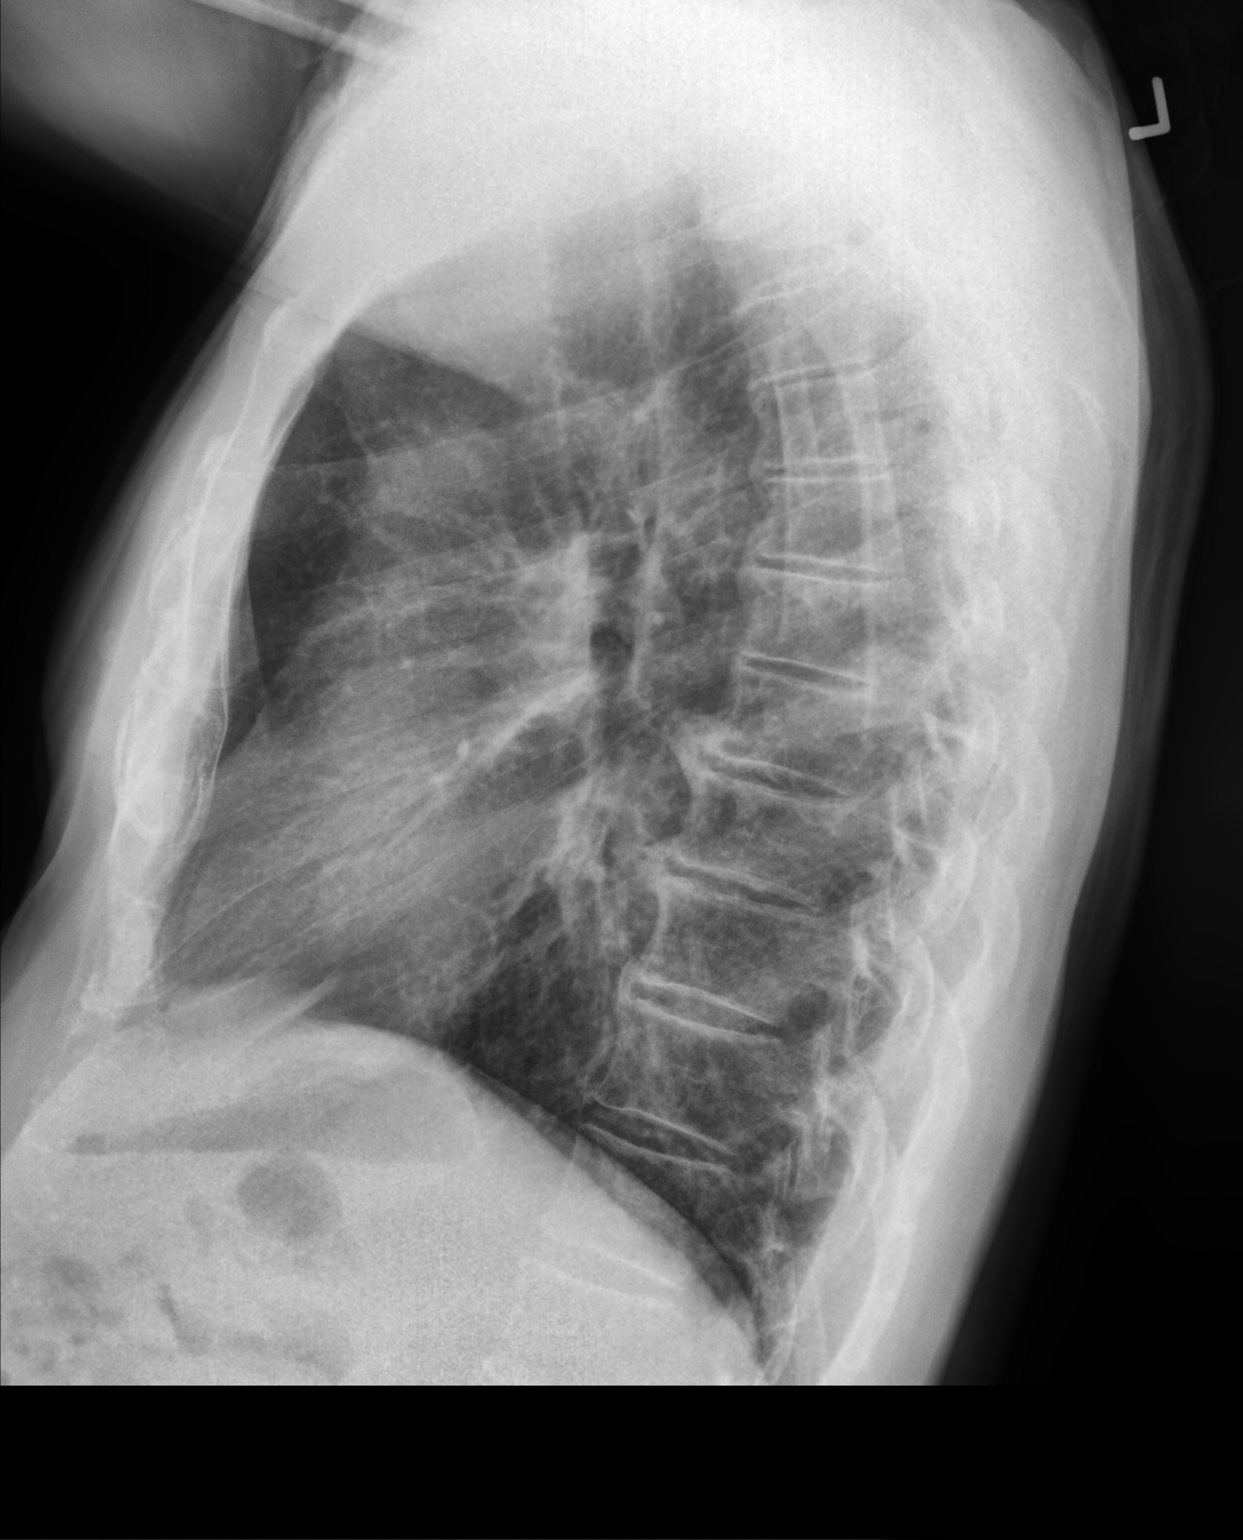

[2 of 2 positions shown; findings below may reference images not displayed]

FINDINGS: The heart size and mediastinal contours are within normal limits.
Both lungs are clear. The visualized skeletal structures are
unremarkable.
IMPRESSION: No active cardiopulmonary disease.

## 2020-12-21 DIAGNOSIS — R319 Hematuria, unspecified: Secondary | ICD-10-CM | POA: Diagnosis not present

## 2020-12-21 DIAGNOSIS — Z8551 Personal history of malignant neoplasm of bladder: Secondary | ICD-10-CM | POA: Diagnosis not present

## 2020-12-21 DIAGNOSIS — C678 Malignant neoplasm of overlapping sites of bladder: Secondary | ICD-10-CM | POA: Diagnosis not present

## 2020-12-21 DIAGNOSIS — R300 Dysuria: Secondary | ICD-10-CM | POA: Diagnosis not present

## 2020-12-21 DIAGNOSIS — C673 Malignant neoplasm of anterior wall of bladder: Secondary | ICD-10-CM | POA: Diagnosis not present

## 2020-12-21 DIAGNOSIS — K828 Other specified diseases of gallbladder: Secondary | ICD-10-CM | POA: Diagnosis not present

## 2020-12-25 DIAGNOSIS — R55 Syncope and collapse: Secondary | ICD-10-CM | POA: Diagnosis not present

## 2020-12-25 DIAGNOSIS — Z85118 Personal history of other malignant neoplasm of bronchus and lung: Secondary | ICD-10-CM | POA: Diagnosis not present

## 2020-12-25 DIAGNOSIS — R42 Dizziness and giddiness: Secondary | ICD-10-CM | POA: Diagnosis not present

## 2020-12-29 DIAGNOSIS — I700 Atherosclerosis of aorta: Secondary | ICD-10-CM | POA: Diagnosis not present

## 2020-12-29 DIAGNOSIS — R918 Other nonspecific abnormal finding of lung field: Secondary | ICD-10-CM | POA: Diagnosis not present

## 2020-12-29 DIAGNOSIS — I2510 Atherosclerotic heart disease of native coronary artery without angina pectoris: Secondary | ICD-10-CM | POA: Diagnosis not present

## 2020-12-29 DIAGNOSIS — J984 Other disorders of lung: Secondary | ICD-10-CM | POA: Diagnosis not present

## 2020-12-29 DIAGNOSIS — C7800 Secondary malignant neoplasm of unspecified lung: Secondary | ICD-10-CM | POA: Diagnosis not present

## 2020-12-31 DIAGNOSIS — C678 Malignant neoplasm of overlapping sites of bladder: Secondary | ICD-10-CM | POA: Diagnosis not present

## 2020-12-31 DIAGNOSIS — R591 Generalized enlarged lymph nodes: Secondary | ICD-10-CM | POA: Diagnosis not present

## 2020-12-31 DIAGNOSIS — Z7951 Long term (current) use of inhaled steroids: Secondary | ICD-10-CM | POA: Diagnosis not present

## 2020-12-31 DIAGNOSIS — R918 Other nonspecific abnormal finding of lung field: Secondary | ICD-10-CM | POA: Diagnosis not present

## 2021-01-01 DIAGNOSIS — C773 Secondary and unspecified malignant neoplasm of axilla and upper limb lymph nodes: Secondary | ICD-10-CM | POA: Diagnosis not present

## 2021-01-01 DIAGNOSIS — C678 Malignant neoplasm of overlapping sites of bladder: Secondary | ICD-10-CM | POA: Diagnosis not present

## 2021-01-01 DIAGNOSIS — C7800 Secondary malignant neoplasm of unspecified lung: Secondary | ICD-10-CM | POA: Diagnosis not present

## 2021-01-01 DIAGNOSIS — C770 Secondary and unspecified malignant neoplasm of lymph nodes of head, face and neck: Secondary | ICD-10-CM | POA: Diagnosis not present

## 2021-01-01 DIAGNOSIS — C679 Malignant neoplasm of bladder, unspecified: Secondary | ICD-10-CM | POA: Diagnosis not present

## 2021-01-01 DIAGNOSIS — R590 Localized enlarged lymph nodes: Secondary | ICD-10-CM | POA: Diagnosis not present

## 2021-01-01 DIAGNOSIS — R599 Enlarged lymph nodes, unspecified: Secondary | ICD-10-CM | POA: Diagnosis not present

## 2021-01-06 DIAGNOSIS — C679 Malignant neoplasm of bladder, unspecified: Secondary | ICD-10-CM | POA: Diagnosis not present

## 2021-01-06 DIAGNOSIS — R918 Other nonspecific abnormal finding of lung field: Secondary | ICD-10-CM | POA: Diagnosis not present

## 2021-01-07 DIAGNOSIS — C801 Malignant (primary) neoplasm, unspecified: Secondary | ICD-10-CM | POA: Diagnosis not present

## 2021-01-07 DIAGNOSIS — C678 Malignant neoplasm of overlapping sites of bladder: Secondary | ICD-10-CM | POA: Diagnosis not present

## 2021-01-07 DIAGNOSIS — C679 Malignant neoplasm of bladder, unspecified: Secondary | ICD-10-CM | POA: Diagnosis not present

## 2021-01-07 DIAGNOSIS — Z452 Encounter for adjustment and management of vascular access device: Secondary | ICD-10-CM | POA: Diagnosis not present

## 2021-01-07 DIAGNOSIS — C7911 Secondary malignant neoplasm of bladder: Secondary | ICD-10-CM | POA: Diagnosis not present

## 2021-01-07 DIAGNOSIS — C3490 Malignant neoplasm of unspecified part of unspecified bronchus or lung: Secondary | ICD-10-CM | POA: Diagnosis not present

## 2021-01-11 DIAGNOSIS — C7801 Secondary malignant neoplasm of right lung: Secondary | ICD-10-CM | POA: Diagnosis not present

## 2021-01-11 DIAGNOSIS — Z95828 Presence of other vascular implants and grafts: Secondary | ICD-10-CM | POA: Diagnosis not present

## 2021-01-11 DIAGNOSIS — R5081 Fever presenting with conditions classified elsewhere: Secondary | ICD-10-CM | POA: Diagnosis not present

## 2021-01-11 DIAGNOSIS — C775 Secondary and unspecified malignant neoplasm of intrapelvic lymph nodes: Secondary | ICD-10-CM | POA: Diagnosis not present

## 2021-01-11 DIAGNOSIS — C7802 Secondary malignant neoplasm of left lung: Secondary | ICD-10-CM | POA: Diagnosis not present

## 2021-01-11 DIAGNOSIS — C678 Malignant neoplasm of overlapping sites of bladder: Secondary | ICD-10-CM | POA: Diagnosis not present

## 2021-01-11 DIAGNOSIS — R509 Fever, unspecified: Secondary | ICD-10-CM | POA: Diagnosis not present

## 2021-01-11 DIAGNOSIS — C772 Secondary and unspecified malignant neoplasm of intra-abdominal lymph nodes: Secondary | ICD-10-CM | POA: Diagnosis not present

## 2021-01-13 DIAGNOSIS — C678 Malignant neoplasm of overlapping sites of bladder: Secondary | ICD-10-CM | POA: Diagnosis not present

## 2021-01-14 DIAGNOSIS — C770 Secondary and unspecified malignant neoplasm of lymph nodes of head, face and neck: Secondary | ICD-10-CM | POA: Diagnosis not present

## 2021-01-14 DIAGNOSIS — C679 Malignant neoplasm of bladder, unspecified: Secondary | ICD-10-CM | POA: Diagnosis not present

## 2021-01-14 DIAGNOSIS — Z79899 Other long term (current) drug therapy: Secondary | ICD-10-CM | POA: Diagnosis not present

## 2021-01-14 DIAGNOSIS — Z5111 Encounter for antineoplastic chemotherapy: Secondary | ICD-10-CM | POA: Diagnosis not present

## 2021-01-14 DIAGNOSIS — C7800 Secondary malignant neoplasm of unspecified lung: Secondary | ICD-10-CM | POA: Diagnosis not present

## 2021-01-21 DIAGNOSIS — C7800 Secondary malignant neoplasm of unspecified lung: Secondary | ICD-10-CM | POA: Diagnosis not present

## 2021-01-21 DIAGNOSIS — C679 Malignant neoplasm of bladder, unspecified: Secondary | ICD-10-CM | POA: Diagnosis not present

## 2021-01-21 DIAGNOSIS — C770 Secondary and unspecified malignant neoplasm of lymph nodes of head, face and neck: Secondary | ICD-10-CM | POA: Diagnosis not present

## 2021-01-21 DIAGNOSIS — Z79899 Other long term (current) drug therapy: Secondary | ICD-10-CM | POA: Diagnosis not present

## 2021-01-21 DIAGNOSIS — Z5111 Encounter for antineoplastic chemotherapy: Secondary | ICD-10-CM | POA: Diagnosis not present

## 2021-01-23 DIAGNOSIS — C678 Malignant neoplasm of overlapping sites of bladder: Secondary | ICD-10-CM | POA: Diagnosis not present

## 2021-02-01 DIAGNOSIS — C7802 Secondary malignant neoplasm of left lung: Secondary | ICD-10-CM | POA: Diagnosis not present

## 2021-02-01 DIAGNOSIS — C678 Malignant neoplasm of overlapping sites of bladder: Secondary | ICD-10-CM | POA: Diagnosis not present

## 2021-02-01 DIAGNOSIS — R059 Cough, unspecified: Secondary | ICD-10-CM | POA: Diagnosis not present

## 2021-02-01 DIAGNOSIS — T451X5D Adverse effect of antineoplastic and immunosuppressive drugs, subsequent encounter: Secondary | ICD-10-CM | POA: Diagnosis not present

## 2021-02-01 DIAGNOSIS — K5903 Drug induced constipation: Secondary | ICD-10-CM | POA: Diagnosis not present

## 2021-02-01 DIAGNOSIS — R1013 Epigastric pain: Secondary | ICD-10-CM | POA: Diagnosis not present

## 2021-02-01 DIAGNOSIS — C771 Secondary and unspecified malignant neoplasm of intrathoracic lymph nodes: Secondary | ICD-10-CM | POA: Diagnosis not present

## 2021-02-01 DIAGNOSIS — C7801 Secondary malignant neoplasm of right lung: Secondary | ICD-10-CM | POA: Diagnosis not present

## 2021-02-01 DIAGNOSIS — R509 Fever, unspecified: Secondary | ICD-10-CM | POA: Diagnosis not present

## 2021-02-04 DIAGNOSIS — Z79899 Other long term (current) drug therapy: Secondary | ICD-10-CM | POA: Diagnosis not present

## 2021-02-04 DIAGNOSIS — C770 Secondary and unspecified malignant neoplasm of lymph nodes of head, face and neck: Secondary | ICD-10-CM | POA: Diagnosis not present

## 2021-02-04 DIAGNOSIS — C7800 Secondary malignant neoplasm of unspecified lung: Secondary | ICD-10-CM | POA: Diagnosis not present

## 2021-02-04 DIAGNOSIS — Z5111 Encounter for antineoplastic chemotherapy: Secondary | ICD-10-CM | POA: Diagnosis not present

## 2021-02-04 DIAGNOSIS — C679 Malignant neoplasm of bladder, unspecified: Secondary | ICD-10-CM | POA: Diagnosis not present

## 2021-02-11 DIAGNOSIS — C679 Malignant neoplasm of bladder, unspecified: Secondary | ICD-10-CM | POA: Diagnosis not present

## 2021-02-11 DIAGNOSIS — C770 Secondary and unspecified malignant neoplasm of lymph nodes of head, face and neck: Secondary | ICD-10-CM | POA: Diagnosis not present

## 2021-02-11 DIAGNOSIS — C7800 Secondary malignant neoplasm of unspecified lung: Secondary | ICD-10-CM | POA: Diagnosis not present

## 2021-02-11 DIAGNOSIS — Z5111 Encounter for antineoplastic chemotherapy: Secondary | ICD-10-CM | POA: Diagnosis not present

## 2021-02-11 DIAGNOSIS — Z79899 Other long term (current) drug therapy: Secondary | ICD-10-CM | POA: Diagnosis not present

## 2021-02-23 DIAGNOSIS — R5081 Fever presenting with conditions classified elsewhere: Secondary | ICD-10-CM | POA: Diagnosis not present

## 2021-02-23 DIAGNOSIS — R590 Localized enlarged lymph nodes: Secondary | ICD-10-CM | POA: Diagnosis not present

## 2021-02-23 DIAGNOSIS — R059 Cough, unspecified: Secondary | ICD-10-CM | POA: Diagnosis not present

## 2021-02-23 DIAGNOSIS — C678 Malignant neoplasm of overlapping sites of bladder: Secondary | ICD-10-CM | POA: Diagnosis not present

## 2021-02-23 DIAGNOSIS — Z79899 Other long term (current) drug therapy: Secondary | ICD-10-CM | POA: Diagnosis not present

## 2021-02-25 DIAGNOSIS — C7800 Secondary malignant neoplasm of unspecified lung: Secondary | ICD-10-CM | POA: Diagnosis not present

## 2021-02-25 DIAGNOSIS — C679 Malignant neoplasm of bladder, unspecified: Secondary | ICD-10-CM | POA: Diagnosis not present

## 2021-02-25 DIAGNOSIS — C770 Secondary and unspecified malignant neoplasm of lymph nodes of head, face and neck: Secondary | ICD-10-CM | POA: Diagnosis not present

## 2021-02-25 DIAGNOSIS — Z79899 Other long term (current) drug therapy: Secondary | ICD-10-CM | POA: Diagnosis not present

## 2021-02-25 DIAGNOSIS — Z5111 Encounter for antineoplastic chemotherapy: Secondary | ICD-10-CM | POA: Diagnosis not present

## 2021-03-04 DIAGNOSIS — C679 Malignant neoplasm of bladder, unspecified: Secondary | ICD-10-CM | POA: Diagnosis not present

## 2021-03-04 DIAGNOSIS — C770 Secondary and unspecified malignant neoplasm of lymph nodes of head, face and neck: Secondary | ICD-10-CM | POA: Diagnosis not present

## 2021-03-04 DIAGNOSIS — Z79899 Other long term (current) drug therapy: Secondary | ICD-10-CM | POA: Diagnosis not present

## 2021-03-04 DIAGNOSIS — C7800 Secondary malignant neoplasm of unspecified lung: Secondary | ICD-10-CM | POA: Diagnosis not present

## 2021-03-04 DIAGNOSIS — Z5111 Encounter for antineoplastic chemotherapy: Secondary | ICD-10-CM | POA: Diagnosis not present

## 2021-03-09 DIAGNOSIS — C3490 Malignant neoplasm of unspecified part of unspecified bronchus or lung: Secondary | ICD-10-CM | POA: Diagnosis not present

## 2021-03-09 DIAGNOSIS — C7801 Secondary malignant neoplasm of right lung: Secondary | ICD-10-CM | POA: Diagnosis not present

## 2021-03-09 DIAGNOSIS — I700 Atherosclerosis of aorta: Secondary | ICD-10-CM | POA: Diagnosis not present

## 2021-03-09 DIAGNOSIS — E871 Hypo-osmolality and hyponatremia: Secondary | ICD-10-CM | POA: Diagnosis not present

## 2021-03-09 DIAGNOSIS — R0602 Shortness of breath: Secondary | ICD-10-CM | POA: Diagnosis not present

## 2021-03-09 DIAGNOSIS — C7802 Secondary malignant neoplasm of left lung: Secondary | ICD-10-CM | POA: Diagnosis not present

## 2021-03-09 DIAGNOSIS — D649 Anemia, unspecified: Secondary | ICD-10-CM | POA: Diagnosis not present

## 2021-03-09 DIAGNOSIS — K769 Liver disease, unspecified: Secondary | ICD-10-CM | POA: Diagnosis not present

## 2021-03-09 DIAGNOSIS — C679 Malignant neoplasm of bladder, unspecified: Secondary | ICD-10-CM | POA: Diagnosis not present

## 2021-03-11 DIAGNOSIS — Z95828 Presence of other vascular implants and grafts: Secondary | ICD-10-CM | POA: Diagnosis not present

## 2021-03-11 DIAGNOSIS — C7802 Secondary malignant neoplasm of left lung: Secondary | ICD-10-CM | POA: Diagnosis not present

## 2021-03-11 DIAGNOSIS — C679 Malignant neoplasm of bladder, unspecified: Secondary | ICD-10-CM | POA: Diagnosis not present

## 2021-03-11 DIAGNOSIS — Z9221 Personal history of antineoplastic chemotherapy: Secondary | ICD-10-CM | POA: Diagnosis not present

## 2021-03-11 DIAGNOSIS — Z79899 Other long term (current) drug therapy: Secondary | ICD-10-CM | POA: Diagnosis not present

## 2021-03-11 DIAGNOSIS — C7801 Secondary malignant neoplasm of right lung: Secondary | ICD-10-CM | POA: Diagnosis not present

## 2021-03-11 DIAGNOSIS — C678 Malignant neoplasm of overlapping sites of bladder: Secondary | ICD-10-CM | POA: Diagnosis not present

## 2021-03-11 DIAGNOSIS — R5081 Fever presenting with conditions classified elsewhere: Secondary | ICD-10-CM | POA: Diagnosis not present

## 2021-03-11 DIAGNOSIS — R059 Cough, unspecified: Secondary | ICD-10-CM | POA: Diagnosis not present

## 2021-03-11 DIAGNOSIS — R0600 Dyspnea, unspecified: Secondary | ICD-10-CM | POA: Diagnosis not present

## 2021-03-12 DIAGNOSIS — C770 Secondary and unspecified malignant neoplasm of lymph nodes of head, face and neck: Secondary | ICD-10-CM | POA: Diagnosis not present

## 2021-03-12 DIAGNOSIS — Z5111 Encounter for antineoplastic chemotherapy: Secondary | ICD-10-CM | POA: Diagnosis not present

## 2021-03-12 DIAGNOSIS — C679 Malignant neoplasm of bladder, unspecified: Secondary | ICD-10-CM | POA: Diagnosis not present

## 2021-03-12 DIAGNOSIS — C7800 Secondary malignant neoplasm of unspecified lung: Secondary | ICD-10-CM | POA: Diagnosis not present

## 2021-03-12 DIAGNOSIS — Z79899 Other long term (current) drug therapy: Secondary | ICD-10-CM | POA: Diagnosis not present

## 2021-03-19 DIAGNOSIS — C7800 Secondary malignant neoplasm of unspecified lung: Secondary | ICD-10-CM | POA: Diagnosis not present

## 2021-03-19 DIAGNOSIS — C770 Secondary and unspecified malignant neoplasm of lymph nodes of head, face and neck: Secondary | ICD-10-CM | POA: Diagnosis not present

## 2021-03-19 DIAGNOSIS — Z5111 Encounter for antineoplastic chemotherapy: Secondary | ICD-10-CM | POA: Diagnosis not present

## 2021-03-19 DIAGNOSIS — Z79899 Other long term (current) drug therapy: Secondary | ICD-10-CM | POA: Diagnosis not present

## 2021-03-19 DIAGNOSIS — C679 Malignant neoplasm of bladder, unspecified: Secondary | ICD-10-CM | POA: Diagnosis not present

## 2021-03-25 DIAGNOSIS — R918 Other nonspecific abnormal finding of lung field: Secondary | ICD-10-CM | POA: Diagnosis not present

## 2021-03-26 DIAGNOSIS — C679 Malignant neoplasm of bladder, unspecified: Secondary | ICD-10-CM | POA: Diagnosis not present

## 2021-03-26 DIAGNOSIS — C7800 Secondary malignant neoplasm of unspecified lung: Secondary | ICD-10-CM | POA: Diagnosis not present

## 2021-03-26 DIAGNOSIS — C770 Secondary and unspecified malignant neoplasm of lymph nodes of head, face and neck: Secondary | ICD-10-CM | POA: Diagnosis not present

## 2021-03-26 DIAGNOSIS — Z5111 Encounter for antineoplastic chemotherapy: Secondary | ICD-10-CM | POA: Diagnosis not present

## 2021-03-26 DIAGNOSIS — Z79899 Other long term (current) drug therapy: Secondary | ICD-10-CM | POA: Diagnosis not present

## 2021-03-29 DIAGNOSIS — C678 Malignant neoplasm of overlapping sites of bladder: Secondary | ICD-10-CM | POA: Diagnosis not present

## 2021-04-01 DIAGNOSIS — C679 Malignant neoplasm of bladder, unspecified: Secondary | ICD-10-CM | POA: Diagnosis not present

## 2021-04-01 DIAGNOSIS — C787 Secondary malignant neoplasm of liver and intrahepatic bile duct: Secondary | ICD-10-CM | POA: Diagnosis not present

## 2021-04-09 DIAGNOSIS — C7801 Secondary malignant neoplasm of right lung: Secondary | ICD-10-CM | POA: Diagnosis not present

## 2021-04-09 DIAGNOSIS — R509 Fever, unspecified: Secondary | ICD-10-CM | POA: Diagnosis not present

## 2021-04-09 DIAGNOSIS — C678 Malignant neoplasm of overlapping sites of bladder: Secondary | ICD-10-CM | POA: Diagnosis not present

## 2021-04-09 DIAGNOSIS — Z95828 Presence of other vascular implants and grafts: Secondary | ICD-10-CM | POA: Diagnosis not present

## 2021-04-09 DIAGNOSIS — C7802 Secondary malignant neoplasm of left lung: Secondary | ICD-10-CM | POA: Diagnosis not present

## 2021-04-09 DIAGNOSIS — C770 Secondary and unspecified malignant neoplasm of lymph nodes of head, face and neck: Secondary | ICD-10-CM | POA: Diagnosis not present

## 2021-04-09 DIAGNOSIS — C679 Malignant neoplasm of bladder, unspecified: Secondary | ICD-10-CM | POA: Diagnosis not present

## 2021-04-09 DIAGNOSIS — Z5111 Encounter for antineoplastic chemotherapy: Secondary | ICD-10-CM | POA: Diagnosis not present

## 2021-04-09 DIAGNOSIS — M9903 Segmental and somatic dysfunction of lumbar region: Secondary | ICD-10-CM | POA: Diagnosis not present

## 2021-04-09 DIAGNOSIS — M5432 Sciatica, left side: Secondary | ICD-10-CM | POA: Diagnosis not present

## 2021-04-09 DIAGNOSIS — Z79899 Other long term (current) drug therapy: Secondary | ICD-10-CM | POA: Diagnosis not present

## 2021-04-09 DIAGNOSIS — K30 Functional dyspepsia: Secondary | ICD-10-CM | POA: Diagnosis not present

## 2021-04-09 DIAGNOSIS — R059 Cough, unspecified: Secondary | ICD-10-CM | POA: Diagnosis not present

## 2021-04-09 DIAGNOSIS — K219 Gastro-esophageal reflux disease without esophagitis: Secondary | ICD-10-CM | POA: Diagnosis not present

## 2021-04-09 DIAGNOSIS — C7800 Secondary malignant neoplasm of unspecified lung: Secondary | ICD-10-CM | POA: Diagnosis not present

## 2021-04-09 DIAGNOSIS — M9902 Segmental and somatic dysfunction of thoracic region: Secondary | ICD-10-CM | POA: Diagnosis not present

## 2021-04-09 DIAGNOSIS — M5137 Other intervertebral disc degeneration, lumbosacral region: Secondary | ICD-10-CM | POA: Diagnosis not present

## 2021-04-09 DIAGNOSIS — R0600 Dyspnea, unspecified: Secondary | ICD-10-CM | POA: Diagnosis not present

## 2021-04-09 DIAGNOSIS — M5135 Other intervertebral disc degeneration, thoracolumbar region: Secondary | ICD-10-CM | POA: Diagnosis not present

## 2021-04-09 DIAGNOSIS — M9904 Segmental and somatic dysfunction of sacral region: Secondary | ICD-10-CM | POA: Diagnosis not present

## 2021-04-15 DIAGNOSIS — M5432 Sciatica, left side: Secondary | ICD-10-CM | POA: Diagnosis not present

## 2021-04-15 DIAGNOSIS — M9904 Segmental and somatic dysfunction of sacral region: Secondary | ICD-10-CM | POA: Diagnosis not present

## 2021-04-15 DIAGNOSIS — M9903 Segmental and somatic dysfunction of lumbar region: Secondary | ICD-10-CM | POA: Diagnosis not present

## 2021-04-15 DIAGNOSIS — M9902 Segmental and somatic dysfunction of thoracic region: Secondary | ICD-10-CM | POA: Diagnosis not present

## 2021-04-15 DIAGNOSIS — M5135 Other intervertebral disc degeneration, thoracolumbar region: Secondary | ICD-10-CM | POA: Diagnosis not present

## 2021-04-15 DIAGNOSIS — M5137 Other intervertebral disc degeneration, lumbosacral region: Secondary | ICD-10-CM | POA: Diagnosis not present

## 2021-04-16 DIAGNOSIS — Z5111 Encounter for antineoplastic chemotherapy: Secondary | ICD-10-CM | POA: Diagnosis not present

## 2021-04-16 DIAGNOSIS — C679 Malignant neoplasm of bladder, unspecified: Secondary | ICD-10-CM | POA: Diagnosis not present

## 2021-04-16 DIAGNOSIS — C770 Secondary and unspecified malignant neoplasm of lymph nodes of head, face and neck: Secondary | ICD-10-CM | POA: Diagnosis not present

## 2021-04-16 DIAGNOSIS — C7800 Secondary malignant neoplasm of unspecified lung: Secondary | ICD-10-CM | POA: Diagnosis not present

## 2021-04-16 DIAGNOSIS — Z79899 Other long term (current) drug therapy: Secondary | ICD-10-CM | POA: Diagnosis not present

## 2021-04-19 DIAGNOSIS — M9904 Segmental and somatic dysfunction of sacral region: Secondary | ICD-10-CM | POA: Diagnosis not present

## 2021-04-19 DIAGNOSIS — M9902 Segmental and somatic dysfunction of thoracic region: Secondary | ICD-10-CM | POA: Diagnosis not present

## 2021-04-19 DIAGNOSIS — M5135 Other intervertebral disc degeneration, thoracolumbar region: Secondary | ICD-10-CM | POA: Diagnosis not present

## 2021-04-19 DIAGNOSIS — M5432 Sciatica, left side: Secondary | ICD-10-CM | POA: Diagnosis not present

## 2021-04-19 DIAGNOSIS — M5137 Other intervertebral disc degeneration, lumbosacral region: Secondary | ICD-10-CM | POA: Diagnosis not present

## 2021-04-19 DIAGNOSIS — M9903 Segmental and somatic dysfunction of lumbar region: Secondary | ICD-10-CM | POA: Diagnosis not present

## 2021-04-23 DIAGNOSIS — C689 Malignant neoplasm of urinary organ, unspecified: Secondary | ICD-10-CM | POA: Diagnosis not present

## 2021-04-23 DIAGNOSIS — C7800 Secondary malignant neoplasm of unspecified lung: Secondary | ICD-10-CM | POA: Diagnosis not present

## 2021-04-23 DIAGNOSIS — C7801 Secondary malignant neoplasm of right lung: Secondary | ICD-10-CM | POA: Diagnosis not present

## 2021-04-23 DIAGNOSIS — Z79899 Other long term (current) drug therapy: Secondary | ICD-10-CM | POA: Diagnosis not present

## 2021-04-23 DIAGNOSIS — Z5111 Encounter for antineoplastic chemotherapy: Secondary | ICD-10-CM | POA: Diagnosis not present

## 2021-04-23 DIAGNOSIS — C770 Secondary and unspecified malignant neoplasm of lymph nodes of head, face and neck: Secondary | ICD-10-CM | POA: Diagnosis not present

## 2021-04-23 DIAGNOSIS — M5440 Lumbago with sciatica, unspecified side: Secondary | ICD-10-CM | POA: Diagnosis not present

## 2021-04-23 DIAGNOSIS — M545 Low back pain, unspecified: Secondary | ICD-10-CM | POA: Diagnosis not present

## 2021-04-23 DIAGNOSIS — C679 Malignant neoplasm of bladder, unspecified: Secondary | ICD-10-CM | POA: Diagnosis not present

## 2021-04-23 DIAGNOSIS — C7802 Secondary malignant neoplasm of left lung: Secondary | ICD-10-CM | POA: Diagnosis not present

## 2021-04-23 DIAGNOSIS — L814 Other melanin hyperpigmentation: Secondary | ICD-10-CM | POA: Diagnosis not present

## 2021-04-23 DIAGNOSIS — L299 Pruritus, unspecified: Secondary | ICD-10-CM | POA: Diagnosis not present

## 2021-04-23 DIAGNOSIS — L819 Disorder of pigmentation, unspecified: Secondary | ICD-10-CM | POA: Diagnosis not present

## 2021-04-25 DIAGNOSIS — R918 Other nonspecific abnormal finding of lung field: Secondary | ICD-10-CM | POA: Diagnosis not present

## 2021-04-26 DIAGNOSIS — M9903 Segmental and somatic dysfunction of lumbar region: Secondary | ICD-10-CM | POA: Diagnosis not present

## 2021-04-26 DIAGNOSIS — M9904 Segmental and somatic dysfunction of sacral region: Secondary | ICD-10-CM | POA: Diagnosis not present

## 2021-04-26 DIAGNOSIS — M5135 Other intervertebral disc degeneration, thoracolumbar region: Secondary | ICD-10-CM | POA: Diagnosis not present

## 2021-04-26 DIAGNOSIS — M9902 Segmental and somatic dysfunction of thoracic region: Secondary | ICD-10-CM | POA: Diagnosis not present

## 2021-04-26 DIAGNOSIS — M5432 Sciatica, left side: Secondary | ICD-10-CM | POA: Diagnosis not present

## 2021-04-26 DIAGNOSIS — M5137 Other intervertebral disc degeneration, lumbosacral region: Secondary | ICD-10-CM | POA: Diagnosis not present

## 2021-05-03 DIAGNOSIS — M5432 Sciatica, left side: Secondary | ICD-10-CM | POA: Diagnosis not present

## 2021-05-03 DIAGNOSIS — M9903 Segmental and somatic dysfunction of lumbar region: Secondary | ICD-10-CM | POA: Diagnosis not present

## 2021-05-03 DIAGNOSIS — M9904 Segmental and somatic dysfunction of sacral region: Secondary | ICD-10-CM | POA: Diagnosis not present

## 2021-05-03 DIAGNOSIS — M5135 Other intervertebral disc degeneration, thoracolumbar region: Secondary | ICD-10-CM | POA: Diagnosis not present

## 2021-05-03 DIAGNOSIS — M9902 Segmental and somatic dysfunction of thoracic region: Secondary | ICD-10-CM | POA: Diagnosis not present

## 2021-05-03 DIAGNOSIS — M5137 Other intervertebral disc degeneration, lumbosacral region: Secondary | ICD-10-CM | POA: Diagnosis not present

## 2021-05-06 DIAGNOSIS — G8929 Other chronic pain: Secondary | ICD-10-CM | POA: Diagnosis not present

## 2021-05-06 DIAGNOSIS — R5081 Fever presenting with conditions classified elsewhere: Secondary | ICD-10-CM | POA: Diagnosis not present

## 2021-05-06 DIAGNOSIS — C7801 Secondary malignant neoplasm of right lung: Secondary | ICD-10-CM | POA: Diagnosis not present

## 2021-05-06 DIAGNOSIS — C7802 Secondary malignant neoplasm of left lung: Secondary | ICD-10-CM | POA: Diagnosis not present

## 2021-05-06 DIAGNOSIS — M545 Low back pain, unspecified: Secondary | ICD-10-CM | POA: Diagnosis not present

## 2021-05-06 DIAGNOSIS — R059 Cough, unspecified: Secondary | ICD-10-CM | POA: Diagnosis not present

## 2021-05-06 DIAGNOSIS — K30 Functional dyspepsia: Secondary | ICD-10-CM | POA: Diagnosis not present

## 2021-05-06 DIAGNOSIS — L270 Generalized skin eruption due to drugs and medicaments taken internally: Secondary | ICD-10-CM | POA: Diagnosis not present

## 2021-05-06 DIAGNOSIS — Z95828 Presence of other vascular implants and grafts: Secondary | ICD-10-CM | POA: Diagnosis not present

## 2021-05-06 DIAGNOSIS — C678 Malignant neoplasm of overlapping sites of bladder: Secondary | ICD-10-CM | POA: Diagnosis not present

## 2021-05-06 DIAGNOSIS — C779 Secondary and unspecified malignant neoplasm of lymph node, unspecified: Secondary | ICD-10-CM | POA: Diagnosis not present

## 2021-05-06 DIAGNOSIS — R509 Fever, unspecified: Secondary | ICD-10-CM | POA: Diagnosis not present

## 2021-05-06 DIAGNOSIS — D709 Neutropenia, unspecified: Secondary | ICD-10-CM | POA: Diagnosis not present

## 2021-05-10 DIAGNOSIS — M5135 Other intervertebral disc degeneration, thoracolumbar region: Secondary | ICD-10-CM | POA: Diagnosis not present

## 2021-05-10 DIAGNOSIS — M9902 Segmental and somatic dysfunction of thoracic region: Secondary | ICD-10-CM | POA: Diagnosis not present

## 2021-05-10 DIAGNOSIS — M9904 Segmental and somatic dysfunction of sacral region: Secondary | ICD-10-CM | POA: Diagnosis not present

## 2021-05-10 DIAGNOSIS — M5137 Other intervertebral disc degeneration, lumbosacral region: Secondary | ICD-10-CM | POA: Diagnosis not present

## 2021-05-10 DIAGNOSIS — M9903 Segmental and somatic dysfunction of lumbar region: Secondary | ICD-10-CM | POA: Diagnosis not present

## 2021-05-10 DIAGNOSIS — M5432 Sciatica, left side: Secondary | ICD-10-CM | POA: Diagnosis not present

## 2021-05-14 DIAGNOSIS — Z79899 Other long term (current) drug therapy: Secondary | ICD-10-CM | POA: Diagnosis not present

## 2021-05-14 DIAGNOSIS — M545 Low back pain, unspecified: Secondary | ICD-10-CM | POA: Diagnosis not present

## 2021-05-14 DIAGNOSIS — M5459 Other low back pain: Secondary | ICD-10-CM | POA: Diagnosis not present

## 2021-05-14 DIAGNOSIS — C7800 Secondary malignant neoplasm of unspecified lung: Secondary | ICD-10-CM | POA: Diagnosis not present

## 2021-05-14 DIAGNOSIS — C678 Malignant neoplasm of overlapping sites of bladder: Secondary | ICD-10-CM | POA: Diagnosis not present

## 2021-05-14 DIAGNOSIS — C679 Malignant neoplasm of bladder, unspecified: Secondary | ICD-10-CM | POA: Diagnosis not present

## 2021-05-14 DIAGNOSIS — C770 Secondary and unspecified malignant neoplasm of lymph nodes of head, face and neck: Secondary | ICD-10-CM | POA: Diagnosis not present

## 2021-05-14 DIAGNOSIS — Z5111 Encounter for antineoplastic chemotherapy: Secondary | ICD-10-CM | POA: Diagnosis not present

## 2021-05-20 DIAGNOSIS — Z79899 Other long term (current) drug therapy: Secondary | ICD-10-CM | POA: Diagnosis not present

## 2021-05-20 DIAGNOSIS — C770 Secondary and unspecified malignant neoplasm of lymph nodes of head, face and neck: Secondary | ICD-10-CM | POA: Diagnosis not present

## 2021-05-20 DIAGNOSIS — C679 Malignant neoplasm of bladder, unspecified: Secondary | ICD-10-CM | POA: Diagnosis not present

## 2021-05-20 DIAGNOSIS — C7800 Secondary malignant neoplasm of unspecified lung: Secondary | ICD-10-CM | POA: Diagnosis not present

## 2021-05-20 DIAGNOSIS — Z5111 Encounter for antineoplastic chemotherapy: Secondary | ICD-10-CM | POA: Diagnosis not present

## 2021-05-21 DIAGNOSIS — M5136 Other intervertebral disc degeneration, lumbar region: Secondary | ICD-10-CM | POA: Diagnosis not present

## 2021-05-25 DIAGNOSIS — R918 Other nonspecific abnormal finding of lung field: Secondary | ICD-10-CM | POA: Diagnosis not present

## 2021-05-28 DIAGNOSIS — Z79899 Other long term (current) drug therapy: Secondary | ICD-10-CM | POA: Diagnosis not present

## 2021-05-28 DIAGNOSIS — Z5111 Encounter for antineoplastic chemotherapy: Secondary | ICD-10-CM | POA: Diagnosis not present

## 2021-05-28 DIAGNOSIS — C770 Secondary and unspecified malignant neoplasm of lymph nodes of head, face and neck: Secondary | ICD-10-CM | POA: Diagnosis not present

## 2021-05-28 DIAGNOSIS — C679 Malignant neoplasm of bladder, unspecified: Secondary | ICD-10-CM | POA: Diagnosis not present

## 2021-05-28 DIAGNOSIS — C7800 Secondary malignant neoplasm of unspecified lung: Secondary | ICD-10-CM | POA: Diagnosis not present

## 2021-06-03 DIAGNOSIS — I2510 Atherosclerotic heart disease of native coronary artery without angina pectoris: Secondary | ICD-10-CM | POA: Diagnosis not present

## 2021-06-03 DIAGNOSIS — I700 Atherosclerosis of aorta: Secondary | ICD-10-CM | POA: Diagnosis not present

## 2021-06-03 DIAGNOSIS — C679 Malignant neoplasm of bladder, unspecified: Secondary | ICD-10-CM | POA: Diagnosis not present

## 2021-06-03 DIAGNOSIS — I6523 Occlusion and stenosis of bilateral carotid arteries: Secondary | ICD-10-CM | POA: Diagnosis not present

## 2021-06-03 DIAGNOSIS — N3289 Other specified disorders of bladder: Secondary | ICD-10-CM | POA: Diagnosis not present

## 2021-06-03 DIAGNOSIS — R590 Localized enlarged lymph nodes: Secondary | ICD-10-CM | POA: Diagnosis not present

## 2021-06-03 DIAGNOSIS — C678 Malignant neoplasm of overlapping sites of bladder: Secondary | ICD-10-CM | POA: Diagnosis not present

## 2021-06-10 DIAGNOSIS — R059 Cough, unspecified: Secondary | ICD-10-CM | POA: Diagnosis not present

## 2021-06-10 DIAGNOSIS — M545 Low back pain, unspecified: Secondary | ICD-10-CM | POA: Diagnosis not present

## 2021-06-10 DIAGNOSIS — R509 Fever, unspecified: Secondary | ICD-10-CM | POA: Diagnosis not present

## 2021-06-10 DIAGNOSIS — R21 Rash and other nonspecific skin eruption: Secondary | ICD-10-CM | POA: Diagnosis not present

## 2021-06-10 DIAGNOSIS — K30 Functional dyspepsia: Secondary | ICD-10-CM | POA: Diagnosis not present

## 2021-06-10 DIAGNOSIS — C770 Secondary and unspecified malignant neoplasm of lymph nodes of head, face and neck: Secondary | ICD-10-CM | POA: Diagnosis not present

## 2021-06-10 DIAGNOSIS — C7800 Secondary malignant neoplasm of unspecified lung: Secondary | ICD-10-CM | POA: Diagnosis not present

## 2021-06-10 DIAGNOSIS — G8929 Other chronic pain: Secondary | ICD-10-CM | POA: Diagnosis not present

## 2021-06-10 DIAGNOSIS — Z7952 Long term (current) use of systemic steroids: Secondary | ICD-10-CM | POA: Diagnosis not present

## 2021-06-10 DIAGNOSIS — Z5111 Encounter for antineoplastic chemotherapy: Secondary | ICD-10-CM | POA: Diagnosis not present

## 2021-06-10 DIAGNOSIS — D709 Neutropenia, unspecified: Secondary | ICD-10-CM | POA: Diagnosis not present

## 2021-06-10 DIAGNOSIS — Z95828 Presence of other vascular implants and grafts: Secondary | ICD-10-CM | POA: Diagnosis not present

## 2021-06-10 DIAGNOSIS — T451X5D Adverse effect of antineoplastic and immunosuppressive drugs, subsequent encounter: Secondary | ICD-10-CM | POA: Diagnosis not present

## 2021-06-10 DIAGNOSIS — K1231 Oral mucositis (ulcerative) due to antineoplastic therapy: Secondary | ICD-10-CM | POA: Diagnosis not present

## 2021-06-10 DIAGNOSIS — Z79899 Other long term (current) drug therapy: Secondary | ICD-10-CM | POA: Diagnosis not present

## 2021-06-10 DIAGNOSIS — C679 Malignant neoplasm of bladder, unspecified: Secondary | ICD-10-CM | POA: Diagnosis not present

## 2021-06-10 DIAGNOSIS — K1230 Oral mucositis (ulcerative), unspecified: Secondary | ICD-10-CM | POA: Diagnosis not present

## 2021-06-18 DIAGNOSIS — C770 Secondary and unspecified malignant neoplasm of lymph nodes of head, face and neck: Secondary | ICD-10-CM | POA: Diagnosis not present

## 2021-06-18 DIAGNOSIS — Z79899 Other long term (current) drug therapy: Secondary | ICD-10-CM | POA: Diagnosis not present

## 2021-06-18 DIAGNOSIS — C7800 Secondary malignant neoplasm of unspecified lung: Secondary | ICD-10-CM | POA: Diagnosis not present

## 2021-06-18 DIAGNOSIS — Z5111 Encounter for antineoplastic chemotherapy: Secondary | ICD-10-CM | POA: Diagnosis not present

## 2021-06-18 DIAGNOSIS — C679 Malignant neoplasm of bladder, unspecified: Secondary | ICD-10-CM | POA: Diagnosis not present

## 2021-06-24 DIAGNOSIS — M5416 Radiculopathy, lumbar region: Secondary | ICD-10-CM | POA: Diagnosis not present

## 2021-06-25 DIAGNOSIS — C770 Secondary and unspecified malignant neoplasm of lymph nodes of head, face and neck: Secondary | ICD-10-CM | POA: Diagnosis not present

## 2021-06-25 DIAGNOSIS — Z5111 Encounter for antineoplastic chemotherapy: Secondary | ICD-10-CM | POA: Diagnosis not present

## 2021-06-25 DIAGNOSIS — R918 Other nonspecific abnormal finding of lung field: Secondary | ICD-10-CM | POA: Diagnosis not present

## 2021-06-25 DIAGNOSIS — C7800 Secondary malignant neoplasm of unspecified lung: Secondary | ICD-10-CM | POA: Diagnosis not present

## 2021-06-25 DIAGNOSIS — Z79899 Other long term (current) drug therapy: Secondary | ICD-10-CM | POA: Diagnosis not present

## 2021-06-25 DIAGNOSIS — C679 Malignant neoplasm of bladder, unspecified: Secondary | ICD-10-CM | POA: Diagnosis not present

## 2021-06-29 DIAGNOSIS — C678 Malignant neoplasm of overlapping sites of bladder: Secondary | ICD-10-CM | POA: Diagnosis not present

## 2021-07-06 DIAGNOSIS — H2513 Age-related nuclear cataract, bilateral: Secondary | ICD-10-CM | POA: Diagnosis not present

## 2021-07-06 DIAGNOSIS — H02726 Madarosis of left eye, unspecified eyelid and periocular area: Secondary | ICD-10-CM | POA: Diagnosis not present

## 2021-07-06 DIAGNOSIS — H16143 Punctate keratitis, bilateral: Secondary | ICD-10-CM | POA: Diagnosis not present

## 2021-07-06 DIAGNOSIS — H02723 Madarosis of right eye, unspecified eyelid and periocular area: Secondary | ICD-10-CM | POA: Diagnosis not present

## 2021-07-08 DIAGNOSIS — Z7952 Long term (current) use of systemic steroids: Secondary | ICD-10-CM | POA: Diagnosis not present

## 2021-07-08 DIAGNOSIS — K1230 Oral mucositis (ulcerative), unspecified: Secondary | ICD-10-CM | POA: Diagnosis not present

## 2021-07-08 DIAGNOSIS — C678 Malignant neoplasm of overlapping sites of bladder: Secondary | ICD-10-CM | POA: Diagnosis not present

## 2021-07-08 DIAGNOSIS — C679 Malignant neoplasm of bladder, unspecified: Secondary | ICD-10-CM | POA: Diagnosis not present

## 2021-07-08 DIAGNOSIS — C770 Secondary and unspecified malignant neoplasm of lymph nodes of head, face and neck: Secondary | ICD-10-CM | POA: Diagnosis not present

## 2021-07-08 DIAGNOSIS — R509 Fever, unspecified: Secondary | ICD-10-CM | POA: Diagnosis not present

## 2021-07-08 DIAGNOSIS — L270 Generalized skin eruption due to drugs and medicaments taken internally: Secondary | ICD-10-CM | POA: Diagnosis not present

## 2021-07-08 DIAGNOSIS — M545 Low back pain, unspecified: Secondary | ICD-10-CM | POA: Diagnosis not present

## 2021-07-08 DIAGNOSIS — R1013 Epigastric pain: Secondary | ICD-10-CM | POA: Diagnosis not present

## 2021-07-08 DIAGNOSIS — T451X5D Adverse effect of antineoplastic and immunosuppressive drugs, subsequent encounter: Secondary | ICD-10-CM | POA: Diagnosis not present

## 2021-07-08 DIAGNOSIS — K1231 Oral mucositis (ulcerative) due to antineoplastic therapy: Secondary | ICD-10-CM | POA: Diagnosis not present

## 2021-07-08 DIAGNOSIS — G620 Drug-induced polyneuropathy: Secondary | ICD-10-CM | POA: Diagnosis not present

## 2021-07-08 DIAGNOSIS — Z79899 Other long term (current) drug therapy: Secondary | ICD-10-CM | POA: Diagnosis not present

## 2021-07-08 DIAGNOSIS — Z5111 Encounter for antineoplastic chemotherapy: Secondary | ICD-10-CM | POA: Diagnosis not present

## 2021-07-08 DIAGNOSIS — Z791 Long term (current) use of non-steroidal anti-inflammatories (NSAID): Secondary | ICD-10-CM | POA: Diagnosis not present

## 2021-07-08 DIAGNOSIS — G8929 Other chronic pain: Secondary | ICD-10-CM | POA: Diagnosis not present

## 2021-07-08 DIAGNOSIS — R059 Cough, unspecified: Secondary | ICD-10-CM | POA: Diagnosis not present

## 2021-07-08 DIAGNOSIS — R0600 Dyspnea, unspecified: Secondary | ICD-10-CM | POA: Diagnosis not present

## 2021-07-08 DIAGNOSIS — Z95828 Presence of other vascular implants and grafts: Secondary | ICD-10-CM | POA: Diagnosis not present

## 2021-07-08 DIAGNOSIS — C7800 Secondary malignant neoplasm of unspecified lung: Secondary | ICD-10-CM | POA: Diagnosis not present

## 2021-07-08 DIAGNOSIS — R5081 Fever presenting with conditions classified elsewhere: Secondary | ICD-10-CM | POA: Diagnosis not present

## 2021-07-14 DIAGNOSIS — Z136 Encounter for screening for cardiovascular disorders: Secondary | ICD-10-CM | POA: Diagnosis not present

## 2021-07-14 DIAGNOSIS — C679 Malignant neoplasm of bladder, unspecified: Secondary | ICD-10-CM | POA: Diagnosis not present

## 2021-07-14 DIAGNOSIS — R3915 Urgency of urination: Secondary | ICD-10-CM | POA: Diagnosis not present

## 2021-07-14 DIAGNOSIS — Z131 Encounter for screening for diabetes mellitus: Secondary | ICD-10-CM | POA: Diagnosis not present

## 2021-07-14 DIAGNOSIS — Z0000 Encounter for general adult medical examination without abnormal findings: Secondary | ICD-10-CM | POA: Diagnosis not present

## 2021-07-14 DIAGNOSIS — E559 Vitamin D deficiency, unspecified: Secondary | ICD-10-CM | POA: Diagnosis not present

## 2021-07-14 DIAGNOSIS — I2510 Atherosclerotic heart disease of native coronary artery without angina pectoris: Secondary | ICD-10-CM | POA: Diagnosis not present

## 2021-07-14 DIAGNOSIS — Z79899 Other long term (current) drug therapy: Secondary | ICD-10-CM | POA: Diagnosis not present

## 2021-07-14 DIAGNOSIS — I700 Atherosclerosis of aorta: Secondary | ICD-10-CM | POA: Diagnosis not present

## 2021-07-14 DIAGNOSIS — C801 Malignant (primary) neoplasm, unspecified: Secondary | ICD-10-CM | POA: Diagnosis not present

## 2021-07-14 DIAGNOSIS — R54 Age-related physical debility: Secondary | ICD-10-CM | POA: Diagnosis not present

## 2021-07-16 DIAGNOSIS — Z5111 Encounter for antineoplastic chemotherapy: Secondary | ICD-10-CM | POA: Diagnosis not present

## 2021-07-16 DIAGNOSIS — C770 Secondary and unspecified malignant neoplasm of lymph nodes of head, face and neck: Secondary | ICD-10-CM | POA: Diagnosis not present

## 2021-07-16 DIAGNOSIS — Z95828 Presence of other vascular implants and grafts: Secondary | ICD-10-CM | POA: Diagnosis not present

## 2021-07-16 DIAGNOSIS — C679 Malignant neoplasm of bladder, unspecified: Secondary | ICD-10-CM | POA: Diagnosis not present

## 2021-07-16 DIAGNOSIS — C7800 Secondary malignant neoplasm of unspecified lung: Secondary | ICD-10-CM | POA: Diagnosis not present

## 2021-07-20 DIAGNOSIS — H04129 Dry eye syndrome of unspecified lacrimal gland: Secondary | ICD-10-CM | POA: Diagnosis not present

## 2021-07-20 DIAGNOSIS — H02723 Madarosis of right eye, unspecified eyelid and periocular area: Secondary | ICD-10-CM | POA: Diagnosis not present

## 2021-07-20 DIAGNOSIS — H02726 Madarosis of left eye, unspecified eyelid and periocular area: Secondary | ICD-10-CM | POA: Diagnosis not present

## 2021-07-20 DIAGNOSIS — H16143 Punctate keratitis, bilateral: Secondary | ICD-10-CM | POA: Diagnosis not present

## 2021-07-23 DIAGNOSIS — Z95828 Presence of other vascular implants and grafts: Secondary | ICD-10-CM | POA: Diagnosis not present

## 2021-07-23 DIAGNOSIS — C7800 Secondary malignant neoplasm of unspecified lung: Secondary | ICD-10-CM | POA: Diagnosis not present

## 2021-07-23 DIAGNOSIS — C679 Malignant neoplasm of bladder, unspecified: Secondary | ICD-10-CM | POA: Diagnosis not present

## 2021-07-23 DIAGNOSIS — Z5111 Encounter for antineoplastic chemotherapy: Secondary | ICD-10-CM | POA: Diagnosis not present

## 2021-07-23 DIAGNOSIS — C770 Secondary and unspecified malignant neoplasm of lymph nodes of head, face and neck: Secondary | ICD-10-CM | POA: Diagnosis not present

## 2021-07-26 DIAGNOSIS — R918 Other nonspecific abnormal finding of lung field: Secondary | ICD-10-CM | POA: Diagnosis not present

## 2021-07-30 DIAGNOSIS — C678 Malignant neoplasm of overlapping sites of bladder: Secondary | ICD-10-CM | POA: Diagnosis not present

## 2021-08-03 DIAGNOSIS — H02723 Madarosis of right eye, unspecified eyelid and periocular area: Secondary | ICD-10-CM | POA: Diagnosis not present

## 2021-08-03 DIAGNOSIS — H02726 Madarosis of left eye, unspecified eyelid and periocular area: Secondary | ICD-10-CM | POA: Diagnosis not present

## 2021-08-03 DIAGNOSIS — H04129 Dry eye syndrome of unspecified lacrimal gland: Secondary | ICD-10-CM | POA: Diagnosis not present

## 2021-08-03 DIAGNOSIS — H16143 Punctate keratitis, bilateral: Secondary | ICD-10-CM | POA: Diagnosis not present

## 2021-08-06 DIAGNOSIS — R059 Cough, unspecified: Secondary | ICD-10-CM | POA: Diagnosis not present

## 2021-08-06 DIAGNOSIS — Z5111 Encounter for antineoplastic chemotherapy: Secondary | ICD-10-CM | POA: Diagnosis not present

## 2021-08-06 DIAGNOSIS — Z5112 Encounter for antineoplastic immunotherapy: Secondary | ICD-10-CM | POA: Diagnosis not present

## 2021-08-06 DIAGNOSIS — C779 Secondary and unspecified malignant neoplasm of lymph node, unspecified: Secondary | ICD-10-CM | POA: Diagnosis not present

## 2021-08-06 DIAGNOSIS — G629 Polyneuropathy, unspecified: Secondary | ICD-10-CM | POA: Diagnosis not present

## 2021-08-06 DIAGNOSIS — C7800 Secondary malignant neoplasm of unspecified lung: Secondary | ICD-10-CM | POA: Diagnosis not present

## 2021-08-06 DIAGNOSIS — Z7952 Long term (current) use of systemic steroids: Secondary | ICD-10-CM | POA: Diagnosis not present

## 2021-08-06 DIAGNOSIS — L299 Pruritus, unspecified: Secondary | ICD-10-CM | POA: Diagnosis not present

## 2021-08-06 DIAGNOSIS — C678 Malignant neoplasm of overlapping sites of bladder: Secondary | ICD-10-CM | POA: Diagnosis not present

## 2021-08-06 DIAGNOSIS — R1013 Epigastric pain: Secondary | ICD-10-CM | POA: Diagnosis not present

## 2021-08-06 DIAGNOSIS — C7802 Secondary malignant neoplasm of left lung: Secondary | ICD-10-CM | POA: Diagnosis not present

## 2021-08-06 DIAGNOSIS — F32A Depression, unspecified: Secondary | ICD-10-CM | POA: Diagnosis not present

## 2021-08-06 DIAGNOSIS — M545 Low back pain, unspecified: Secondary | ICD-10-CM | POA: Diagnosis not present

## 2021-08-06 DIAGNOSIS — C679 Malignant neoplasm of bladder, unspecified: Secondary | ICD-10-CM | POA: Diagnosis not present

## 2021-08-06 DIAGNOSIS — Z79899 Other long term (current) drug therapy: Secondary | ICD-10-CM | POA: Diagnosis not present

## 2021-08-06 DIAGNOSIS — C7801 Secondary malignant neoplasm of right lung: Secondary | ICD-10-CM | POA: Diagnosis not present

## 2021-08-06 DIAGNOSIS — C770 Secondary and unspecified malignant neoplasm of lymph nodes of head, face and neck: Secondary | ICD-10-CM | POA: Diagnosis not present

## 2021-08-06 DIAGNOSIS — Z95828 Presence of other vascular implants and grafts: Secondary | ICD-10-CM | POA: Diagnosis not present

## 2021-08-06 DIAGNOSIS — G8929 Other chronic pain: Secondary | ICD-10-CM | POA: Diagnosis not present

## 2021-08-06 DIAGNOSIS — D708 Other neutropenia: Secondary | ICD-10-CM | POA: Diagnosis not present

## 2021-08-06 DIAGNOSIS — I10 Essential (primary) hypertension: Secondary | ICD-10-CM | POA: Diagnosis not present

## 2021-08-06 DIAGNOSIS — K1230 Oral mucositis (ulcerative), unspecified: Secondary | ICD-10-CM | POA: Diagnosis not present

## 2021-08-06 DIAGNOSIS — R5081 Fever presenting with conditions classified elsewhere: Secondary | ICD-10-CM | POA: Diagnosis not present

## 2021-08-10 DIAGNOSIS — R030 Elevated blood-pressure reading, without diagnosis of hypertension: Secondary | ICD-10-CM | POA: Diagnosis not present

## 2021-08-13 DIAGNOSIS — C7800 Secondary malignant neoplasm of unspecified lung: Secondary | ICD-10-CM | POA: Diagnosis not present

## 2021-08-13 DIAGNOSIS — Z5111 Encounter for antineoplastic chemotherapy: Secondary | ICD-10-CM | POA: Diagnosis not present

## 2021-08-13 DIAGNOSIS — C679 Malignant neoplasm of bladder, unspecified: Secondary | ICD-10-CM | POA: Diagnosis not present

## 2021-08-13 DIAGNOSIS — C770 Secondary and unspecified malignant neoplasm of lymph nodes of head, face and neck: Secondary | ICD-10-CM | POA: Diagnosis not present

## 2021-08-23 DIAGNOSIS — C7800 Secondary malignant neoplasm of unspecified lung: Secondary | ICD-10-CM | POA: Diagnosis not present

## 2021-08-23 DIAGNOSIS — Z5111 Encounter for antineoplastic chemotherapy: Secondary | ICD-10-CM | POA: Diagnosis not present

## 2021-08-23 DIAGNOSIS — C770 Secondary and unspecified malignant neoplasm of lymph nodes of head, face and neck: Secondary | ICD-10-CM | POA: Diagnosis not present

## 2021-08-23 DIAGNOSIS — C679 Malignant neoplasm of bladder, unspecified: Secondary | ICD-10-CM | POA: Diagnosis not present

## 2021-08-25 DIAGNOSIS — R918 Other nonspecific abnormal finding of lung field: Secondary | ICD-10-CM | POA: Diagnosis not present

## 2021-08-26 DIAGNOSIS — C678 Malignant neoplasm of overlapping sites of bladder: Secondary | ICD-10-CM | POA: Diagnosis not present

## 2021-08-26 DIAGNOSIS — I7789 Other specified disorders of arteries and arterioles: Secondary | ICD-10-CM | POA: Diagnosis not present

## 2021-08-26 DIAGNOSIS — N3289 Other specified disorders of bladder: Secondary | ICD-10-CM | POA: Diagnosis not present

## 2021-08-26 DIAGNOSIS — R590 Localized enlarged lymph nodes: Secondary | ICD-10-CM | POA: Diagnosis not present

## 2021-08-26 DIAGNOSIS — J929 Pleural plaque without asbestos: Secondary | ICD-10-CM | POA: Diagnosis not present

## 2021-08-26 DIAGNOSIS — C679 Malignant neoplasm of bladder, unspecified: Secondary | ICD-10-CM | POA: Diagnosis not present

## 2021-08-26 DIAGNOSIS — R918 Other nonspecific abnormal finding of lung field: Secondary | ICD-10-CM | POA: Diagnosis not present

## 2021-08-29 DIAGNOSIS — C678 Malignant neoplasm of overlapping sites of bladder: Secondary | ICD-10-CM | POA: Diagnosis not present

## 2021-08-30 DIAGNOSIS — R0609 Other forms of dyspnea: Secondary | ICD-10-CM | POA: Diagnosis not present

## 2021-08-30 DIAGNOSIS — R059 Cough, unspecified: Secondary | ICD-10-CM | POA: Diagnosis not present

## 2021-08-30 DIAGNOSIS — Z20822 Contact with and (suspected) exposure to covid-19: Secondary | ICD-10-CM | POA: Diagnosis not present

## 2021-08-30 DIAGNOSIS — C7801 Secondary malignant neoplasm of right lung: Secondary | ICD-10-CM | POA: Diagnosis not present

## 2021-08-30 DIAGNOSIS — C678 Malignant neoplasm of overlapping sites of bladder: Secondary | ICD-10-CM | POA: Diagnosis not present

## 2021-08-30 DIAGNOSIS — R053 Chronic cough: Secondary | ICD-10-CM | POA: Diagnosis not present

## 2021-08-30 DIAGNOSIS — R509 Fever, unspecified: Secondary | ICD-10-CM | POA: Diagnosis not present

## 2021-08-30 DIAGNOSIS — Z9221 Personal history of antineoplastic chemotherapy: Secondary | ICD-10-CM | POA: Diagnosis not present

## 2021-08-30 DIAGNOSIS — L270 Generalized skin eruption due to drugs and medicaments taken internally: Secondary | ICD-10-CM | POA: Diagnosis not present

## 2021-08-30 DIAGNOSIS — Z95828 Presence of other vascular implants and grafts: Secondary | ICD-10-CM | POA: Diagnosis not present

## 2021-08-30 DIAGNOSIS — M5136 Other intervertebral disc degeneration, lumbar region: Secondary | ICD-10-CM | POA: Diagnosis not present

## 2021-08-30 DIAGNOSIS — M545 Low back pain, unspecified: Secondary | ICD-10-CM | POA: Diagnosis not present

## 2021-08-30 DIAGNOSIS — G8929 Other chronic pain: Secondary | ICD-10-CM | POA: Diagnosis not present

## 2021-08-30 DIAGNOSIS — D709 Neutropenia, unspecified: Secondary | ICD-10-CM | POA: Diagnosis not present

## 2021-08-30 DIAGNOSIS — R0600 Dyspnea, unspecified: Secondary | ICD-10-CM | POA: Diagnosis not present

## 2021-08-30 DIAGNOSIS — R1013 Epigastric pain: Secondary | ICD-10-CM | POA: Diagnosis not present

## 2021-08-30 DIAGNOSIS — I10 Essential (primary) hypertension: Secondary | ICD-10-CM | POA: Diagnosis not present

## 2021-08-30 DIAGNOSIS — C7802 Secondary malignant neoplasm of left lung: Secondary | ICD-10-CM | POA: Diagnosis not present

## 2021-08-30 DIAGNOSIS — K1230 Oral mucositis (ulcerative), unspecified: Secondary | ICD-10-CM | POA: Diagnosis not present

## 2021-08-30 DIAGNOSIS — Z452 Encounter for adjustment and management of vascular access device: Secondary | ICD-10-CM | POA: Diagnosis not present

## 2021-08-30 DIAGNOSIS — Z5112 Encounter for antineoplastic immunotherapy: Secondary | ICD-10-CM | POA: Diagnosis not present

## 2021-09-05 DIAGNOSIS — C7989 Secondary malignant neoplasm of other specified sites: Secondary | ICD-10-CM | POA: Diagnosis not present

## 2021-09-05 DIAGNOSIS — Z7960 Long term (current) use of unspecified immunomodulators and immunosuppressants: Secondary | ICD-10-CM | POA: Diagnosis not present

## 2021-09-05 DIAGNOSIS — E871 Hypo-osmolality and hyponatremia: Secondary | ICD-10-CM | POA: Diagnosis not present

## 2021-09-05 DIAGNOSIS — R6520 Severe sepsis without septic shock: Secondary | ICD-10-CM | POA: Diagnosis not present

## 2021-09-05 DIAGNOSIS — R279 Unspecified lack of coordination: Secondary | ICD-10-CM | POA: Diagnosis not present

## 2021-09-05 DIAGNOSIS — I129 Hypertensive chronic kidney disease with stage 1 through stage 4 chronic kidney disease, or unspecified chronic kidney disease: Secondary | ICD-10-CM | POA: Diagnosis not present

## 2021-09-05 DIAGNOSIS — R0902 Hypoxemia: Secondary | ICD-10-CM | POA: Diagnosis not present

## 2021-09-05 DIAGNOSIS — Z681 Body mass index (BMI) 19 or less, adult: Secondary | ICD-10-CM | POA: Diagnosis not present

## 2021-09-05 DIAGNOSIS — R5081 Fever presenting with conditions classified elsewhere: Secondary | ICD-10-CM | POA: Diagnosis not present

## 2021-09-05 DIAGNOSIS — N182 Chronic kidney disease, stage 2 (mild): Secondary | ICD-10-CM | POA: Diagnosis not present

## 2021-09-05 DIAGNOSIS — J849 Interstitial pulmonary disease, unspecified: Secondary | ICD-10-CM | POA: Diagnosis not present

## 2021-09-05 DIAGNOSIS — R0789 Other chest pain: Secondary | ICD-10-CM | POA: Diagnosis not present

## 2021-09-05 DIAGNOSIS — K219 Gastro-esophageal reflux disease without esophagitis: Secondary | ICD-10-CM | POA: Diagnosis not present

## 2021-09-05 DIAGNOSIS — R638 Other symptoms and signs concerning food and fluid intake: Secondary | ICD-10-CM | POA: Diagnosis not present

## 2021-09-05 DIAGNOSIS — J9600 Acute respiratory failure, unspecified whether with hypoxia or hypercapnia: Secondary | ICD-10-CM | POA: Diagnosis not present

## 2021-09-05 DIAGNOSIS — E1122 Type 2 diabetes mellitus with diabetic chronic kidney disease: Secondary | ICD-10-CM | POA: Diagnosis not present

## 2021-09-05 DIAGNOSIS — Z515 Encounter for palliative care: Secondary | ICD-10-CM | POA: Diagnosis not present

## 2021-09-05 DIAGNOSIS — J702 Acute drug-induced interstitial lung disorders: Secondary | ICD-10-CM | POA: Diagnosis not present

## 2021-09-05 DIAGNOSIS — J704 Drug-induced interstitial lung disorders, unspecified: Secondary | ICD-10-CM | POA: Diagnosis not present

## 2021-09-05 DIAGNOSIS — C7800 Secondary malignant neoplasm of unspecified lung: Secondary | ICD-10-CM | POA: Diagnosis not present

## 2021-09-05 DIAGNOSIS — Z95828 Presence of other vascular implants and grafts: Secondary | ICD-10-CM | POA: Diagnosis not present

## 2021-09-05 DIAGNOSIS — J9601 Acute respiratory failure with hypoxia: Secondary | ICD-10-CM | POA: Diagnosis not present

## 2021-09-05 DIAGNOSIS — R0600 Dyspnea, unspecified: Secondary | ICD-10-CM | POA: Diagnosis not present

## 2021-09-05 DIAGNOSIS — Z8551 Personal history of malignant neoplasm of bladder: Secondary | ICD-10-CM | POA: Diagnosis not present

## 2021-09-05 DIAGNOSIS — D84821 Immunodeficiency due to drugs: Secondary | ICD-10-CM | POA: Diagnosis not present

## 2021-09-05 DIAGNOSIS — J8489 Other specified interstitial pulmonary diseases: Secondary | ICD-10-CM | POA: Diagnosis not present

## 2021-09-05 DIAGNOSIS — R5383 Other fatigue: Secondary | ICD-10-CM | POA: Diagnosis not present

## 2021-09-05 DIAGNOSIS — C801 Malignant (primary) neoplasm, unspecified: Secondary | ICD-10-CM | POA: Diagnosis not present

## 2021-09-05 DIAGNOSIS — R0602 Shortness of breath: Secondary | ICD-10-CM | POA: Diagnosis not present

## 2021-09-05 DIAGNOSIS — Z9981 Dependence on supplemental oxygen: Secondary | ICD-10-CM | POA: Diagnosis not present

## 2021-09-05 DIAGNOSIS — C679 Malignant neoplasm of bladder, unspecified: Secondary | ICD-10-CM | POA: Diagnosis not present

## 2021-09-05 DIAGNOSIS — F32A Depression, unspecified: Secondary | ICD-10-CM | POA: Diagnosis not present

## 2021-09-05 DIAGNOSIS — C7801 Secondary malignant neoplasm of right lung: Secondary | ICD-10-CM | POA: Diagnosis not present

## 2021-09-05 DIAGNOSIS — Z20822 Contact with and (suspected) exposure to covid-19: Secondary | ICD-10-CM | POA: Diagnosis not present

## 2021-09-05 DIAGNOSIS — A419 Sepsis, unspecified organism: Secondary | ICD-10-CM | POA: Diagnosis not present

## 2021-09-05 DIAGNOSIS — I493 Ventricular premature depolarization: Secondary | ICD-10-CM | POA: Diagnosis not present

## 2021-09-05 DIAGNOSIS — D638 Anemia in other chronic diseases classified elsewhere: Secondary | ICD-10-CM | POA: Diagnosis not present

## 2021-09-05 DIAGNOSIS — Z743 Need for continuous supervision: Secondary | ICD-10-CM | POA: Diagnosis not present

## 2021-09-05 DIAGNOSIS — Z8249 Family history of ischemic heart disease and other diseases of the circulatory system: Secondary | ICD-10-CM | POA: Diagnosis not present

## 2021-09-05 DIAGNOSIS — J811 Chronic pulmonary edema: Secondary | ICD-10-CM | POA: Diagnosis not present

## 2021-09-05 DIAGNOSIS — I517 Cardiomegaly: Secondary | ICD-10-CM | POA: Diagnosis not present

## 2021-09-05 DIAGNOSIS — J9690 Respiratory failure, unspecified, unspecified whether with hypoxia or hypercapnia: Secondary | ICD-10-CM | POA: Diagnosis not present

## 2021-09-05 DIAGNOSIS — C7911 Secondary malignant neoplasm of bladder: Secondary | ICD-10-CM | POA: Diagnosis not present

## 2021-09-05 DIAGNOSIS — Z8709 Personal history of other diseases of the respiratory system: Secondary | ICD-10-CM | POA: Diagnosis not present

## 2021-09-05 DIAGNOSIS — R531 Weakness: Secondary | ICD-10-CM | POA: Diagnosis not present

## 2021-09-05 DIAGNOSIS — T451X5A Adverse effect of antineoplastic and immunosuppressive drugs, initial encounter: Secondary | ICD-10-CM | POA: Diagnosis not present

## 2021-09-05 DIAGNOSIS — J679 Hypersensitivity pneumonitis due to unspecified organic dust: Secondary | ICD-10-CM | POA: Diagnosis not present

## 2021-09-05 DIAGNOSIS — R918 Other nonspecific abnormal finding of lung field: Secondary | ICD-10-CM | POA: Diagnosis not present

## 2021-09-05 DIAGNOSIS — R2689 Other abnormalities of gait and mobility: Secondary | ICD-10-CM | POA: Diagnosis not present

## 2021-09-05 DIAGNOSIS — J690 Pneumonitis due to inhalation of food and vomit: Secondary | ICD-10-CM | POA: Diagnosis not present

## 2021-09-05 DIAGNOSIS — C678 Malignant neoplasm of overlapping sites of bladder: Secondary | ICD-10-CM | POA: Diagnosis not present

## 2021-09-05 DIAGNOSIS — R509 Fever, unspecified: Secondary | ICD-10-CM | POA: Diagnosis not present

## 2021-09-05 DIAGNOSIS — E441 Mild protein-calorie malnutrition: Secondary | ICD-10-CM | POA: Diagnosis not present

## 2021-09-05 DIAGNOSIS — Z66 Do not resuscitate: Secondary | ICD-10-CM | POA: Diagnosis not present

## 2021-09-05 DIAGNOSIS — N3289 Other specified disorders of bladder: Secondary | ICD-10-CM | POA: Diagnosis not present

## 2021-09-05 DIAGNOSIS — C7802 Secondary malignant neoplasm of left lung: Secondary | ICD-10-CM | POA: Diagnosis not present

## 2021-09-05 DIAGNOSIS — R55 Syncope and collapse: Secondary | ICD-10-CM | POA: Diagnosis not present

## 2021-09-05 DIAGNOSIS — D704 Cyclic neutropenia: Secondary | ICD-10-CM | POA: Diagnosis not present

## 2021-09-05 DIAGNOSIS — E119 Type 2 diabetes mellitus without complications: Secondary | ICD-10-CM | POA: Diagnosis not present

## 2021-09-06 DIAGNOSIS — E119 Type 2 diabetes mellitus without complications: Secondary | ICD-10-CM | POA: Diagnosis not present

## 2021-09-06 DIAGNOSIS — C7911 Secondary malignant neoplasm of bladder: Secondary | ICD-10-CM | POA: Diagnosis not present

## 2021-09-06 DIAGNOSIS — C801 Malignant (primary) neoplasm, unspecified: Secondary | ICD-10-CM | POA: Diagnosis not present

## 2021-09-06 DIAGNOSIS — J9601 Acute respiratory failure with hypoxia: Secondary | ICD-10-CM | POA: Diagnosis not present

## 2021-09-06 DIAGNOSIS — R0602 Shortness of breath: Secondary | ICD-10-CM | POA: Diagnosis not present

## 2021-09-06 DIAGNOSIS — J849 Interstitial pulmonary disease, unspecified: Secondary | ICD-10-CM | POA: Diagnosis not present

## 2021-09-06 DIAGNOSIS — I517 Cardiomegaly: Secondary | ICD-10-CM | POA: Diagnosis not present

## 2021-09-06 DIAGNOSIS — E871 Hypo-osmolality and hyponatremia: Secondary | ICD-10-CM | POA: Diagnosis not present

## 2021-09-06 DIAGNOSIS — R5081 Fever presenting with conditions classified elsewhere: Secondary | ICD-10-CM | POA: Diagnosis not present

## 2021-09-07 DIAGNOSIS — J9601 Acute respiratory failure with hypoxia: Secondary | ICD-10-CM | POA: Diagnosis not present

## 2021-09-07 DIAGNOSIS — E119 Type 2 diabetes mellitus without complications: Secondary | ICD-10-CM | POA: Diagnosis not present

## 2021-09-07 DIAGNOSIS — E871 Hypo-osmolality and hyponatremia: Secondary | ICD-10-CM | POA: Diagnosis not present

## 2021-09-08 DIAGNOSIS — R0902 Hypoxemia: Secondary | ICD-10-CM | POA: Diagnosis not present

## 2021-09-08 DIAGNOSIS — C801 Malignant (primary) neoplasm, unspecified: Secondary | ICD-10-CM | POA: Diagnosis not present

## 2021-09-08 DIAGNOSIS — E119 Type 2 diabetes mellitus without complications: Secondary | ICD-10-CM | POA: Diagnosis not present

## 2021-09-08 DIAGNOSIS — C7911 Secondary malignant neoplasm of bladder: Secondary | ICD-10-CM | POA: Diagnosis not present

## 2021-09-08 DIAGNOSIS — C7800 Secondary malignant neoplasm of unspecified lung: Secondary | ICD-10-CM | POA: Diagnosis not present

## 2021-09-08 DIAGNOSIS — R918 Other nonspecific abnormal finding of lung field: Secondary | ICD-10-CM | POA: Diagnosis not present

## 2021-09-08 DIAGNOSIS — J9601 Acute respiratory failure with hypoxia: Secondary | ICD-10-CM | POA: Diagnosis not present

## 2021-09-08 DIAGNOSIS — E871 Hypo-osmolality and hyponatremia: Secondary | ICD-10-CM | POA: Diagnosis not present

## 2021-09-08 DIAGNOSIS — R0602 Shortness of breath: Secondary | ICD-10-CM | POA: Diagnosis not present

## 2021-09-08 DIAGNOSIS — I517 Cardiomegaly: Secondary | ICD-10-CM | POA: Diagnosis not present

## 2021-09-08 DIAGNOSIS — R509 Fever, unspecified: Secondary | ICD-10-CM | POA: Diagnosis not present

## 2021-09-08 DIAGNOSIS — C679 Malignant neoplasm of bladder, unspecified: Secondary | ICD-10-CM | POA: Diagnosis not present

## 2021-09-09 DIAGNOSIS — Z9981 Dependence on supplemental oxygen: Secondary | ICD-10-CM | POA: Diagnosis not present

## 2021-09-09 DIAGNOSIS — K219 Gastro-esophageal reflux disease without esophagitis: Secondary | ICD-10-CM | POA: Diagnosis not present

## 2021-09-09 DIAGNOSIS — C679 Malignant neoplasm of bladder, unspecified: Secondary | ICD-10-CM | POA: Diagnosis not present

## 2021-09-09 DIAGNOSIS — R638 Other symptoms and signs concerning food and fluid intake: Secondary | ICD-10-CM | POA: Diagnosis not present

## 2021-09-09 DIAGNOSIS — E119 Type 2 diabetes mellitus without complications: Secondary | ICD-10-CM | POA: Diagnosis not present

## 2021-09-09 DIAGNOSIS — R509 Fever, unspecified: Secondary | ICD-10-CM | POA: Diagnosis not present

## 2021-09-09 DIAGNOSIS — C7800 Secondary malignant neoplasm of unspecified lung: Secondary | ICD-10-CM | POA: Diagnosis not present

## 2021-09-09 DIAGNOSIS — I493 Ventricular premature depolarization: Secondary | ICD-10-CM | POA: Diagnosis not present

## 2021-09-09 DIAGNOSIS — Z95828 Presence of other vascular implants and grafts: Secondary | ICD-10-CM | POA: Diagnosis not present

## 2021-09-09 DIAGNOSIS — J9601 Acute respiratory failure with hypoxia: Secondary | ICD-10-CM | POA: Diagnosis not present

## 2021-09-09 DIAGNOSIS — Z515 Encounter for palliative care: Secondary | ICD-10-CM | POA: Diagnosis not present

## 2021-09-09 DIAGNOSIS — E871 Hypo-osmolality and hyponatremia: Secondary | ICD-10-CM | POA: Diagnosis not present

## 2021-09-10 DIAGNOSIS — Z95828 Presence of other vascular implants and grafts: Secondary | ICD-10-CM | POA: Diagnosis not present

## 2021-09-10 DIAGNOSIS — E871 Hypo-osmolality and hyponatremia: Secondary | ICD-10-CM | POA: Diagnosis not present

## 2021-09-10 DIAGNOSIS — C678 Malignant neoplasm of overlapping sites of bladder: Secondary | ICD-10-CM | POA: Diagnosis not present

## 2021-09-10 DIAGNOSIS — C7801 Secondary malignant neoplasm of right lung: Secondary | ICD-10-CM | POA: Diagnosis not present

## 2021-09-10 DIAGNOSIS — C679 Malignant neoplasm of bladder, unspecified: Secondary | ICD-10-CM | POA: Diagnosis not present

## 2021-09-10 DIAGNOSIS — R531 Weakness: Secondary | ICD-10-CM | POA: Diagnosis not present

## 2021-09-10 DIAGNOSIS — Z9981 Dependence on supplemental oxygen: Secondary | ICD-10-CM | POA: Diagnosis not present

## 2021-09-10 DIAGNOSIS — J9601 Acute respiratory failure with hypoxia: Secondary | ICD-10-CM | POA: Diagnosis not present

## 2021-09-10 DIAGNOSIS — C7802 Secondary malignant neoplasm of left lung: Secondary | ICD-10-CM | POA: Diagnosis not present

## 2021-09-10 DIAGNOSIS — E119 Type 2 diabetes mellitus without complications: Secondary | ICD-10-CM | POA: Diagnosis not present

## 2021-09-10 DIAGNOSIS — J702 Acute drug-induced interstitial lung disorders: Secondary | ICD-10-CM | POA: Diagnosis not present

## 2021-09-10 DIAGNOSIS — R509 Fever, unspecified: Secondary | ICD-10-CM | POA: Diagnosis not present

## 2021-09-11 DIAGNOSIS — C679 Malignant neoplasm of bladder, unspecified: Secondary | ICD-10-CM | POA: Diagnosis not present

## 2021-09-11 DIAGNOSIS — J9601 Acute respiratory failure with hypoxia: Secondary | ICD-10-CM | POA: Diagnosis not present

## 2021-09-11 DIAGNOSIS — C7800 Secondary malignant neoplasm of unspecified lung: Secondary | ICD-10-CM | POA: Diagnosis not present

## 2021-09-11 DIAGNOSIS — E871 Hypo-osmolality and hyponatremia: Secondary | ICD-10-CM | POA: Diagnosis not present

## 2021-09-11 DIAGNOSIS — E119 Type 2 diabetes mellitus without complications: Secondary | ICD-10-CM | POA: Diagnosis not present

## 2021-09-12 DIAGNOSIS — E119 Type 2 diabetes mellitus without complications: Secondary | ICD-10-CM | POA: Diagnosis not present

## 2021-09-12 DIAGNOSIS — J811 Chronic pulmonary edema: Secondary | ICD-10-CM | POA: Diagnosis not present

## 2021-09-12 DIAGNOSIS — J9690 Respiratory failure, unspecified, unspecified whether with hypoxia or hypercapnia: Secondary | ICD-10-CM | POA: Diagnosis not present

## 2021-09-12 DIAGNOSIS — J9601 Acute respiratory failure with hypoxia: Secondary | ICD-10-CM | POA: Diagnosis not present

## 2021-09-12 DIAGNOSIS — C679 Malignant neoplasm of bladder, unspecified: Secondary | ICD-10-CM | POA: Diagnosis not present

## 2021-09-12 DIAGNOSIS — E871 Hypo-osmolality and hyponatremia: Secondary | ICD-10-CM | POA: Diagnosis not present

## 2021-09-12 DIAGNOSIS — R0902 Hypoxemia: Secondary | ICD-10-CM | POA: Diagnosis not present

## 2021-09-12 DIAGNOSIS — C7800 Secondary malignant neoplasm of unspecified lung: Secondary | ICD-10-CM | POA: Diagnosis not present

## 2021-09-13 DIAGNOSIS — R638 Other symptoms and signs concerning food and fluid intake: Secondary | ICD-10-CM | POA: Diagnosis not present

## 2021-09-13 DIAGNOSIS — J9600 Acute respiratory failure, unspecified whether with hypoxia or hypercapnia: Secondary | ICD-10-CM | POA: Diagnosis not present

## 2021-09-13 DIAGNOSIS — R55 Syncope and collapse: Secondary | ICD-10-CM | POA: Diagnosis not present

## 2021-09-13 DIAGNOSIS — J9601 Acute respiratory failure with hypoxia: Secondary | ICD-10-CM | POA: Diagnosis not present

## 2021-09-13 DIAGNOSIS — Z66 Do not resuscitate: Secondary | ICD-10-CM | POA: Diagnosis not present

## 2021-09-13 DIAGNOSIS — R0902 Hypoxemia: Secondary | ICD-10-CM | POA: Diagnosis not present

## 2021-09-13 DIAGNOSIS — Z95828 Presence of other vascular implants and grafts: Secondary | ICD-10-CM | POA: Diagnosis not present

## 2021-09-13 DIAGNOSIS — R509 Fever, unspecified: Secondary | ICD-10-CM | POA: Diagnosis not present

## 2021-09-13 DIAGNOSIS — Z515 Encounter for palliative care: Secondary | ICD-10-CM | POA: Diagnosis not present

## 2021-09-13 DIAGNOSIS — C801 Malignant (primary) neoplasm, unspecified: Secondary | ICD-10-CM | POA: Diagnosis not present

## 2021-09-13 DIAGNOSIS — C679 Malignant neoplasm of bladder, unspecified: Secondary | ICD-10-CM | POA: Diagnosis not present

## 2021-09-13 DIAGNOSIS — Z9981 Dependence on supplemental oxygen: Secondary | ICD-10-CM | POA: Diagnosis not present

## 2021-09-13 DIAGNOSIS — E119 Type 2 diabetes mellitus without complications: Secondary | ICD-10-CM | POA: Diagnosis not present

## 2021-09-13 DIAGNOSIS — K219 Gastro-esophageal reflux disease without esophagitis: Secondary | ICD-10-CM | POA: Diagnosis not present

## 2021-09-13 DIAGNOSIS — E871 Hypo-osmolality and hyponatremia: Secondary | ICD-10-CM | POA: Diagnosis not present

## 2021-09-13 DIAGNOSIS — C7800 Secondary malignant neoplasm of unspecified lung: Secondary | ICD-10-CM | POA: Diagnosis not present

## 2021-09-13 DIAGNOSIS — C7911 Secondary malignant neoplasm of bladder: Secondary | ICD-10-CM | POA: Diagnosis not present

## 2021-09-14 DIAGNOSIS — C679 Malignant neoplasm of bladder, unspecified: Secondary | ICD-10-CM | POA: Diagnosis not present

## 2021-09-14 DIAGNOSIS — J9601 Acute respiratory failure with hypoxia: Secondary | ICD-10-CM | POA: Diagnosis not present

## 2021-09-14 DIAGNOSIS — N182 Chronic kidney disease, stage 2 (mild): Secondary | ICD-10-CM | POA: Diagnosis not present

## 2021-09-14 DIAGNOSIS — F32A Depression, unspecified: Secondary | ICD-10-CM | POA: Diagnosis not present

## 2021-09-14 DIAGNOSIS — E441 Mild protein-calorie malnutrition: Secondary | ICD-10-CM | POA: Diagnosis not present

## 2021-09-14 DIAGNOSIS — C801 Malignant (primary) neoplasm, unspecified: Secondary | ICD-10-CM | POA: Diagnosis not present

## 2021-09-14 DIAGNOSIS — D638 Anemia in other chronic diseases classified elsewhere: Secondary | ICD-10-CM | POA: Diagnosis not present

## 2021-09-14 DIAGNOSIS — I129 Hypertensive chronic kidney disease with stage 1 through stage 4 chronic kidney disease, or unspecified chronic kidney disease: Secondary | ICD-10-CM | POA: Diagnosis not present

## 2021-09-14 DIAGNOSIS — Z681 Body mass index (BMI) 19 or less, adult: Secondary | ICD-10-CM | POA: Diagnosis not present

## 2021-09-14 DIAGNOSIS — R531 Weakness: Secondary | ICD-10-CM | POA: Diagnosis not present

## 2021-09-14 DIAGNOSIS — J9600 Acute respiratory failure, unspecified whether with hypoxia or hypercapnia: Secondary | ICD-10-CM | POA: Diagnosis not present

## 2021-09-15 DIAGNOSIS — E119 Type 2 diabetes mellitus without complications: Secondary | ICD-10-CM | POA: Diagnosis not present

## 2021-09-15 DIAGNOSIS — C7800 Secondary malignant neoplasm of unspecified lung: Secondary | ICD-10-CM | POA: Diagnosis not present

## 2021-09-15 DIAGNOSIS — F32A Depression, unspecified: Secondary | ICD-10-CM | POA: Diagnosis not present

## 2021-09-15 DIAGNOSIS — C7801 Secondary malignant neoplasm of right lung: Secondary | ICD-10-CM | POA: Diagnosis not present

## 2021-09-15 DIAGNOSIS — T451X5A Adverse effect of antineoplastic and immunosuppressive drugs, initial encounter: Secondary | ICD-10-CM | POA: Diagnosis not present

## 2021-09-15 DIAGNOSIS — J704 Drug-induced interstitial lung disorders, unspecified: Secondary | ICD-10-CM | POA: Diagnosis not present

## 2021-09-15 DIAGNOSIS — J9600 Acute respiratory failure, unspecified whether with hypoxia or hypercapnia: Secondary | ICD-10-CM | POA: Diagnosis not present

## 2021-09-15 DIAGNOSIS — R5383 Other fatigue: Secondary | ICD-10-CM | POA: Diagnosis not present

## 2021-09-15 DIAGNOSIS — C7802 Secondary malignant neoplasm of left lung: Secondary | ICD-10-CM | POA: Diagnosis not present

## 2021-09-15 DIAGNOSIS — J9601 Acute respiratory failure with hypoxia: Secondary | ICD-10-CM | POA: Diagnosis not present

## 2021-09-15 DIAGNOSIS — C679 Malignant neoplasm of bladder, unspecified: Secondary | ICD-10-CM | POA: Diagnosis not present

## 2021-09-15 DIAGNOSIS — D704 Cyclic neutropenia: Secondary | ICD-10-CM | POA: Diagnosis not present

## 2021-09-16 DIAGNOSIS — T451X5A Adverse effect of antineoplastic and immunosuppressive drugs, initial encounter: Secondary | ICD-10-CM | POA: Diagnosis not present

## 2021-09-16 DIAGNOSIS — J9601 Acute respiratory failure with hypoxia: Secondary | ICD-10-CM | POA: Diagnosis not present

## 2021-09-16 DIAGNOSIS — R5383 Other fatigue: Secondary | ICD-10-CM | POA: Diagnosis not present

## 2021-09-16 DIAGNOSIS — Z9981 Dependence on supplemental oxygen: Secondary | ICD-10-CM | POA: Diagnosis not present

## 2021-09-16 DIAGNOSIS — C7800 Secondary malignant neoplasm of unspecified lung: Secondary | ICD-10-CM | POA: Diagnosis not present

## 2021-09-16 DIAGNOSIS — E119 Type 2 diabetes mellitus without complications: Secondary | ICD-10-CM | POA: Diagnosis not present

## 2021-09-16 DIAGNOSIS — D704 Cyclic neutropenia: Secondary | ICD-10-CM | POA: Diagnosis not present

## 2021-09-16 DIAGNOSIS — F32A Depression, unspecified: Secondary | ICD-10-CM | POA: Diagnosis not present

## 2021-09-16 DIAGNOSIS — C679 Malignant neoplasm of bladder, unspecified: Secondary | ICD-10-CM | POA: Diagnosis not present

## 2021-09-16 DIAGNOSIS — J690 Pneumonitis due to inhalation of food and vomit: Secondary | ICD-10-CM | POA: Diagnosis not present

## 2021-09-16 DIAGNOSIS — J704 Drug-induced interstitial lung disorders, unspecified: Secondary | ICD-10-CM | POA: Diagnosis not present

## 2021-09-16 DIAGNOSIS — J9600 Acute respiratory failure, unspecified whether with hypoxia or hypercapnia: Secondary | ICD-10-CM | POA: Diagnosis not present

## 2021-09-17 DIAGNOSIS — J704 Drug-induced interstitial lung disorders, unspecified: Secondary | ICD-10-CM | POA: Diagnosis not present

## 2021-09-17 DIAGNOSIS — T451X5A Adverse effect of antineoplastic and immunosuppressive drugs, initial encounter: Secondary | ICD-10-CM | POA: Diagnosis not present

## 2021-09-17 DIAGNOSIS — D704 Cyclic neutropenia: Secondary | ICD-10-CM | POA: Diagnosis not present

## 2021-09-17 DIAGNOSIS — C679 Malignant neoplasm of bladder, unspecified: Secondary | ICD-10-CM | POA: Diagnosis not present

## 2021-09-17 DIAGNOSIS — J9601 Acute respiratory failure with hypoxia: Secondary | ICD-10-CM | POA: Diagnosis not present

## 2021-09-17 DIAGNOSIS — E119 Type 2 diabetes mellitus without complications: Secondary | ICD-10-CM | POA: Diagnosis not present

## 2021-09-17 DIAGNOSIS — F32A Depression, unspecified: Secondary | ICD-10-CM | POA: Diagnosis not present

## 2021-09-17 DIAGNOSIS — C7800 Secondary malignant neoplasm of unspecified lung: Secondary | ICD-10-CM | POA: Diagnosis not present

## 2021-09-17 DIAGNOSIS — R2689 Other abnormalities of gait and mobility: Secondary | ICD-10-CM | POA: Diagnosis not present

## 2021-09-17 DIAGNOSIS — R5383 Other fatigue: Secondary | ICD-10-CM | POA: Diagnosis not present

## 2021-09-18 DIAGNOSIS — J704 Drug-induced interstitial lung disorders, unspecified: Secondary | ICD-10-CM | POA: Diagnosis not present

## 2021-09-18 DIAGNOSIS — C7800 Secondary malignant neoplasm of unspecified lung: Secondary | ICD-10-CM | POA: Diagnosis not present

## 2021-09-18 DIAGNOSIS — J9601 Acute respiratory failure with hypoxia: Secondary | ICD-10-CM | POA: Diagnosis not present

## 2021-09-18 DIAGNOSIS — T451X5A Adverse effect of antineoplastic and immunosuppressive drugs, initial encounter: Secondary | ICD-10-CM | POA: Diagnosis not present

## 2021-09-18 DIAGNOSIS — R2689 Other abnormalities of gait and mobility: Secondary | ICD-10-CM | POA: Diagnosis not present

## 2021-09-18 DIAGNOSIS — C679 Malignant neoplasm of bladder, unspecified: Secondary | ICD-10-CM | POA: Diagnosis not present

## 2021-09-25 DIAGNOSIS — R918 Other nonspecific abnormal finding of lung field: Secondary | ICD-10-CM | POA: Diagnosis not present

## 2021-09-29 DIAGNOSIS — C678 Malignant neoplasm of overlapping sites of bladder: Secondary | ICD-10-CM | POA: Diagnosis not present

## 2021-10-25 DIAGNOSIS — R918 Other nonspecific abnormal finding of lung field: Secondary | ICD-10-CM | POA: Diagnosis not present

## 2021-10-29 DIAGNOSIS — C678 Malignant neoplasm of overlapping sites of bladder: Secondary | ICD-10-CM | POA: Diagnosis not present

## 2021-11-14 DEATH — deceased
